# Patient Record
Sex: Female | Born: 1985 | Race: White | Hispanic: No | Marital: Married | Smoking: Current every day smoker
Health system: Southern US, Community
[De-identification: ages and names within clinical notes are randomized; demographics above are authoritative.]

## PROBLEM LIST (undated history)

## (undated) ENCOUNTER — Ambulatory Visit (HOSPITAL_COMMUNITY): Admission: EM | Discharge status: Absent without leave | Payer: 59

## (undated) DIAGNOSIS — F32A Depression, unspecified: Secondary | ICD-10-CM

## (undated) DIAGNOSIS — Z789 Other specified health status: Secondary | ICD-10-CM

## (undated) DIAGNOSIS — F419 Anxiety disorder, unspecified: Secondary | ICD-10-CM

## (undated) HISTORY — PX: TUBAL LIGATION: SHX77

## (undated) HISTORY — DX: Depression, unspecified: F32.A

## (undated) HISTORY — DX: Anxiety disorder, unspecified: F41.9

## (undated) HISTORY — PX: APPENDECTOMY: SHX54

---

## 2005-07-03 DIAGNOSIS — F1920 Other psychoactive substance dependence, uncomplicated: Secondary | ICD-10-CM

## 2005-07-03 HISTORY — DX: Other psychoactive substance dependence, uncomplicated: F19.20

## 2010-05-27 ENCOUNTER — Emergency Department (HOSPITAL_COMMUNITY)
Admission: EM | Admit: 2010-05-27 | Discharge: 2010-05-27 | Payer: Self-pay | Source: Home / Self Care | Admitting: Emergency Medicine

## 2011-05-22 ENCOUNTER — Inpatient Hospital Stay (HOSPITAL_COMMUNITY)
Admission: EM | Admit: 2011-05-22 | Discharge: 2011-05-23 | DRG: 343 | Disposition: A | Discharge status: Home / Self Care | Payer: Self-pay | Attending: Surgery | Admitting: Surgery

## 2011-05-22 ENCOUNTER — Emergency Department (INDEPENDENT_AMBULATORY_CARE_PROVIDER_SITE_OTHER)
Admission: EM | Admit: 2011-05-22 | Discharge: 2011-05-22 | Disposition: A | Discharge status: Nursing Home (Includes ICF) | Payer: Self-pay | Source: Home / Self Care

## 2011-05-22 ENCOUNTER — Encounter: Payer: Self-pay | Admitting: *Deleted

## 2011-05-22 ENCOUNTER — Emergency Department (HOSPITAL_COMMUNITY): Payer: Self-pay

## 2011-05-22 ENCOUNTER — Encounter (HOSPITAL_COMMUNITY): Payer: Self-pay | Admitting: *Deleted

## 2011-05-22 DIAGNOSIS — Z23 Encounter for immunization: Secondary | ICD-10-CM

## 2011-05-22 DIAGNOSIS — Z9049 Acquired absence of other specified parts of digestive tract: Secondary | ICD-10-CM

## 2011-05-22 DIAGNOSIS — R1031 Right lower quadrant pain: Secondary | ICD-10-CM

## 2011-05-22 DIAGNOSIS — K3580 Unspecified acute appendicitis: Secondary | ICD-10-CM | POA: Diagnosis present

## 2011-05-22 DIAGNOSIS — K37 Unspecified appendicitis: Principal | ICD-10-CM | POA: Diagnosis present

## 2011-05-22 HISTORY — DX: Other specified health status: Z78.9

## 2011-05-22 LAB — DIFFERENTIAL
Basophils Absolute: 0 10*3/uL (ref 0.0–0.1)
Eosinophils Absolute: 0.2 10*3/uL (ref 0.0–0.7)
Eosinophils Relative: 2 % (ref 0–5)
Neutrophils Relative %: 62 % (ref 43–77)

## 2011-05-22 LAB — POCT I-STAT, CHEM 8
BUN: 11 mg/dL (ref 6–23)
Chloride: 103 mEq/L (ref 96–112)
Creatinine, Ser: 0.6 mg/dL (ref 0.50–1.10)
Glucose, Bld: 86 mg/dL (ref 70–99)
Potassium: 4.2 mEq/L (ref 3.5–5.1)

## 2011-05-22 LAB — POCT URINALYSIS DIP (DEVICE)
Bilirubin Urine: NEGATIVE
Glucose, UA: NEGATIVE mg/dL
Hgb urine dipstick: NEGATIVE
Nitrite: NEGATIVE
Urobilinogen, UA: 0.2 mg/dL (ref 0.0–1.0)
pH: 6.5 (ref 5.0–8.0)

## 2011-05-22 LAB — CBC
MCH: 29.7 pg (ref 26.0–34.0)
MCV: 90.9 fL (ref 78.0–100.0)
Platelets: 200 10*3/uL (ref 150–400)
RDW: 12.9 % (ref 11.5–15.5)
WBC: 8 10*3/uL (ref 4.0–10.5)

## 2011-05-22 MED ORDER — ONDANSETRON HCL 4 MG/2ML IJ SOLN
4.0000 mg | Freq: Once | INTRAMUSCULAR | Status: AC
  Administered 2011-05-22: 4 mg via INTRAVENOUS
  Filled 2011-05-22: qty 2

## 2011-05-22 MED ORDER — HYDROMORPHONE HCL PF 1 MG/ML IJ SOLN
1.0000 mg | Freq: Once | INTRAMUSCULAR | Status: AC
  Administered 2011-05-22: 1 mg via INTRAVENOUS
  Filled 2011-05-22: qty 1

## 2011-05-22 MED ORDER — SODIUM CHLORIDE 0.9 % IV SOLN
Freq: Once | INTRAVENOUS | Status: AC
  Administered 2011-05-22 – 2011-05-23 (×2): via INTRAVENOUS

## 2011-05-22 NOTE — ED Provider Notes (Signed)
Medical screening examination/treatment/procedure(s) were performed by non-physician practitioner and as supervising physician I was immediately available for consultation/collaboration.   Jshawn Hurta L Kamariya Blevens, MD 05/22/11 2332 

## 2011-05-22 NOTE — ED Provider Notes (Signed)
History     CSN: [id number] Arrival date & time: 05/22/2011  4:58 PM   None     Chief Complaint  Patient presents with  . Abdominal Pain    (Consider location/radiation/quality/duration/timing/severity/associated sxs/prior treatment) HPI Comments: Pt states she awoke at 4 am this morning with severe RLQ abd pain, nausea and vomiting. She also had a tactile fever. The pain was severe and she considered going to the ER but instead decided to take Hydrocodone and Phenergan that she had at home. She has nausea and vomited one time after eating. No diarrhea. Last BM was 2-3 days ago - normal for her. She denies dysuria, urinary frequency or urgency, or vaginal discharge. She had a normal menses approx 1 1/2 weeks ago. The pain has improved some since this morning.   History reviewed. No pertinent past medical history.  Past Surgical History  Procedure Date  . Cesarean section   . Tubal ligation     Family History  Problem Relation Age of Onset  . Hypertension Father   . Heart disease Father     History  Substance Use Topics  . Smoking status: Current Everyday Smoker  . Smokeless tobacco: Not on file  . Alcohol Use: Yes     occassional    OB History    Grav Para Term Preterm Abortions TAB SAB Ect Mult Living                  Review of Systems  Constitutional: Negative for fever and chills.  Respiratory: Negative for cough and chest tightness.   Cardiovascular: Negative for chest pain.  Gastrointestinal: Positive for nausea, vomiting and abdominal pain. Negative for diarrhea and constipation.  Genitourinary: Negative for dysuria, urgency, frequency, vaginal bleeding and vaginal discharge.    Allergies  Review of patient's allergies indicates no known allergies.  Home Medications   Current Outpatient Rx  Name Route Sig Dispense Refill  . PROMETHAZINE HCL 25 MG PO TABS Oral Take 25 mg by mouth as needed.        BP 104/69  Pulse 88  Temp(Src) 98 F (36.7 C)  (Oral)  Resp 18  SpO2 98%  LMP 05/17/2011  Physical Exam  Nursing note and vitals reviewed. Constitutional: She appears well-developed and well-nourished. No distress.  Cardiovascular: Normal rate, regular rhythm and normal heart sounds.   Pulmonary/Chest: Effort normal and breath sounds normal. No respiratory distress.  Abdominal: Soft. Bowel sounds are normal. She exhibits no mass. There is no hepatosplenomegaly. There is tenderness in the right lower quadrant. There is tenderness at McBurney's point. There is no rigidity, no rebound, no guarding, no CVA tenderness and negative Murphy's sign.  Skin: Skin is warm and dry.  Psychiatric: She has a normal mood and affect.    ED Course  Procedures (including critical care time)  Labs Reviewed  POCT URINALYSIS DIP (DEVICE) - Abnormal; Notable for the following:    Leukocytes, UA SMALL (*) Biochemical Testing Only. Please order routine urinalysis from main lab if confirmatory testing is needed.   All other components within normal limits  POCT PREGNANCY, URINE  POCT URINALYSIS DIPSTICK   No results found.   No diagnosis found.    MDM  Pt transferred to ED for imaging studies.         Melody Comas, [place] 05/22/11 [phone]

## 2011-05-22 NOTE — ED Notes (Signed)
rlq pain  Since 0400am today.  Transferred from [facility].  Nausea  lmp last wednesday

## 2011-05-22 NOTE — ED Provider Notes (Signed)
Medical screening examination/treatment/procedure(s) were performed by non-physician practitioner and as supervising physician I was immediately available for consultation/collaboration.  Raynald Blend, MD 05/22/11 1840

## 2011-05-22 NOTE — ED Notes (Signed)
Pt c/o RLQ pain onset 4am today.  Has had N/V also.  States that she took her husband's Promethazine for the pain.  Tried eating cheese crackers around 3pm, but vomited them.  Pt tender to palpation around umbilicus and very tender on RLQ.  States pain is better when she has her legs pulled up.  States she thinks she had a fever this am.  C/o urinary frequency past few days, but denies burning or pressure with urination.  Last BM was Saturday which she states is normal for her. Denies vaginal discharge.

## 2011-05-22 NOTE — ED Provider Notes (Signed)
Medical screening examination/treatment/procedure(s) were performed by non-physician practitioner and as supervising physician I was immediately available for consultation/collaboration.   Jolin Benavides L Rashae Rother, MD 05/22/11 2248 

## 2011-05-22 NOTE — ED Provider Notes (Addendum)
History     CSN: [id number] Arrival date & time: 05/22/2011  6:24 PM   First MD Initiated Contact with Patient 05/22/11 2210      Chief Complaint  Patient presents with  . Abdominal Pain    (Consider location/radiation/quality/duration/timing/severity/associated sxs/prior treatment) HPI Comments: Awakened at 4am with stabbing RLQ pain that has been unchanged throughout the days + nausea, no change in bowel/bladder denies vag discharge abnormal PAP's LMP 8 days ago   Patient is a 25 y.o. female presenting with abdominal pain. The history is provided by the patient.  Abdominal Pain The primary symptoms of the illness include abdominal pain and nausea. The primary symptoms of the illness do not include fever, fatigue, vomiting, diarrhea, dysuria, vaginal discharge or vaginal bleeding. The current episode started 13 to 24 hours ago. The onset of the illness was sudden. The problem has not changed since onset. The patient states that she believes she is currently not pregnant. The patient has not had a change in bowel habit. Symptoms associated with the illness do not include chills, constipation, urgency, frequency or back pain.    History reviewed. No pertinent past medical history.  Past Surgical History  Procedure Date  . Cesarean section   . Tubal ligation     Family History  Problem Relation Age of Onset  . Hypertension Father   . Heart disease Father     History  Substance Use Topics  . Smoking status: Current Everyday Smoker  . Smokeless tobacco: Not on file  . Alcohol Use: Yes     occassional    OB History    Grav Para Term Preterm Abortions TAB SAB Ect Mult Living                  Review of Systems  Constitutional: Negative.  Negative for fever, chills and fatigue.  HENT: Negative.   Eyes: Negative.   Respiratory: Negative.   Cardiovascular: Negative.   Gastrointestinal: Positive for nausea, abdominal pain and abdominal distention. Negative for vomiting,  diarrhea and constipation.  Genitourinary: Negative for dysuria, urgency, frequency, vaginal bleeding, vaginal discharge and pelvic pain.  Musculoskeletal: Negative.  Negative for back pain.  Skin: Negative.   Neurological: Negative.   Psychiatric/Behavioral: Negative.     Allergies  Review of patient's allergies indicates no known allergies.  Home Medications   Current Outpatient Rx  Name Route Sig Dispense Refill  . PROMETHAZINE HCL 25 MG PO TABS Oral Take 25 mg by mouth as needed. nausea      BP 103/72  Pulse 92  Temp(Src) 97.9 F (36.6 C) (Oral)  Resp 19  SpO2 100%  LMP 05/17/2011  Physical Exam  Constitutional: She appears well-developed and well-nourished.  HENT:  Head: Atraumatic.  Eyes: EOM are normal.  Neck: Neck supple.  Cardiovascular: Regular rhythm.   Pulmonary/Chest: Breath sounds normal.  Abdominal: Soft. She exhibits no distension. There is tenderness. There is no rebound and no guarding.  Genitourinary: There is tenderness around the vagina. No vaginal discharge found.  Lymphadenopathy:       Right: Inguinal adenopathy present.    ED Course  Procedures (including critical care time)   Labs Reviewed  CBC  DIFFERENTIAL  I-STAT, CHEM 8  GC/CHLAMYDIA PROBE AMP, GENITAL  WET PREP, GENITAL   No results found.   No diagnosis found.  .now After review of CT Scans aptient has contusions no fractures Will DC home with pain control 3:43 AM Dr. Laurelyn Sickle will see  MDM  Will  prefeorm pelvic exam get ultrasound         Arman Filter, NP 05/22/11 2216  Arman Filter, NP 05/22/11 2252  Arman Filter, NP 05/22/11 2259  Arman Filter, NP 05/23/11 [phone]

## 2011-05-23 ENCOUNTER — Emergency Department (HOSPITAL_COMMUNITY): Payer: Self-pay | Admitting: Anesthesiology

## 2011-05-23 ENCOUNTER — Encounter (HOSPITAL_COMMUNITY): Payer: Self-pay | Admitting: Anesthesiology

## 2011-05-23 ENCOUNTER — Other Ambulatory Visit (INDEPENDENT_AMBULATORY_CARE_PROVIDER_SITE_OTHER): Payer: Self-pay | Admitting: General Surgery

## 2011-05-23 ENCOUNTER — Encounter (HOSPITAL_COMMUNITY): Payer: Self-pay | Admitting: Emergency Medicine

## 2011-05-23 ENCOUNTER — Encounter (HOSPITAL_COMMUNITY): Admission: EM | Disposition: A | Discharge status: Home / Self Care | Payer: Self-pay | Source: Home / Self Care

## 2011-05-23 ENCOUNTER — Encounter (HOSPITAL_COMMUNITY): Payer: Self-pay | Admitting: General Surgery

## 2011-05-23 ENCOUNTER — Encounter (HOSPITAL_COMMUNITY): Payer: Self-pay | Admitting: General Practice

## 2011-05-23 ENCOUNTER — Emergency Department (HOSPITAL_COMMUNITY): Payer: Self-pay

## 2011-05-23 DIAGNOSIS — K3580 Unspecified acute appendicitis: Secondary | ICD-10-CM | POA: Diagnosis present

## 2011-05-23 DIAGNOSIS — Z9049 Acquired absence of other specified parts of digestive tract: Secondary | ICD-10-CM

## 2011-05-23 HISTORY — DX: Unspecified acute appendicitis: K35.80

## 2011-05-23 HISTORY — PX: LAPAROSCOPIC APPENDECTOMY: SHX408

## 2011-05-23 LAB — GC/CHLAMYDIA PROBE AMP, GENITAL
Chlamydia, DNA Probe: NEGATIVE
GC Probe Amp, Genital: NEGATIVE

## 2011-05-23 SURGERY — APPENDECTOMY, LAPAROSCOPIC
Anesthesia: General | Site: Abdomen | Wound class: Contaminated

## 2011-05-23 MED ORDER — DEXAMETHASONE SODIUM PHOSPHATE 10 MG/ML IJ SOLN
INTRAMUSCULAR | Status: DC | PRN
  Administered 2011-05-23: 4 mg via INTRAVENOUS

## 2011-05-23 MED ORDER — INFLUENZA VIRUS VACC SPLIT PF IM SUSP: 0.5000 mL | INTRAMUSCULAR | Status: DC

## 2011-05-23 MED ORDER — ONDANSETRON HCL 4 MG/2ML IJ SOLN: 4.0000 mg | Freq: Four times a day (QID) | INTRAMUSCULAR | Status: DC | PRN

## 2011-05-23 MED ORDER — LACTATED RINGERS IV SOLN
INTRAVENOUS | Status: DC | PRN
  Administered 2011-05-23: 07:00:00 via INTRAVENOUS

## 2011-05-23 MED ORDER — SODIUM CHLORIDE 0.9 % IV SOLN
1.0000 g | Freq: Once | INTRAVENOUS | Status: AC
  Administered 2011-05-23: 1 g via INTRAVENOUS
  Filled 2011-05-23: qty 1

## 2011-05-23 MED ORDER — METOCLOPRAMIDE HCL 5 MG/ML IJ SOLN: 10.0000 mg | Freq: Once | INTRAMUSCULAR | Status: DC | PRN

## 2011-05-23 MED ORDER — ONDANSETRON HCL 4 MG/2ML IJ SOLN
INTRAMUSCULAR | Status: DC | PRN
  Administered 2011-05-23: 4 mg via INTRAVENOUS

## 2011-05-23 MED ORDER — NICOTINE 14 MG/24HR TD PT24
14.0000 mg | MEDICATED_PATCH | Freq: Every day | TRANSDERMAL | Status: DC
Start: 2011-05-23 — End: 2011-05-23
  Filled 2011-05-23: qty 1

## 2011-05-23 MED ORDER — HYDROCODONE-ACETAMINOPHEN 5-325 MG PO TABS
1.0000 | ORAL_TABLET | ORAL | Status: DC | PRN
  Administered 2011-05-23 (×2): 2 via ORAL
  Filled 2011-05-23 (×2): qty 2

## 2011-05-23 MED ORDER — DIPHENHYDRAMINE HCL 12.5 MG/5ML PO ELIX
12.5000 mg | ORAL_SOLUTION | Freq: Four times a day (QID) | ORAL | Status: DC | PRN
  Filled 2011-05-23: qty 10

## 2011-05-23 MED ORDER — KETOROLAC TROMETHAMINE 30 MG/ML IJ SOLN: 15.0000 mg | Freq: Once | INTRAMUSCULAR | Status: DC | PRN

## 2011-05-23 MED ORDER — SODIUM CHLORIDE 0.9 % IR SOLN
Status: DC | PRN
  Administered 2011-05-23: 1

## 2011-05-23 MED ORDER — NEOSTIGMINE METHYLSULFATE 1 MG/ML IJ SOLN
INTRAMUSCULAR | Status: DC | PRN
  Administered 2011-05-23: 2 mg via INTRAVENOUS

## 2011-05-23 MED ORDER — DOCUSATE SODIUM 100 MG PO CAPS
100.0000 mg | ORAL_CAPSULE | Freq: Two times a day (BID) | ORAL | Status: DC
  Administered 2011-05-23: 100 mg via ORAL
  Filled 2011-05-23: qty 1

## 2011-05-23 MED ORDER — DROPERIDOL 2.5 MG/ML IJ SOLN
INTRAMUSCULAR | Status: DC | PRN
  Administered 2011-05-23: .6 mg via INTRAVENOUS

## 2011-05-23 MED ORDER — ACETAMINOPHEN 325 MG PO TABS: 650.0000 mg | ORAL_TABLET | Freq: Four times a day (QID) | ORAL | Status: DC | PRN

## 2011-05-23 MED ORDER — HYDROMORPHONE HCL PF 1 MG/ML IJ SOLN
1.0000 mg | Freq: Once | INTRAMUSCULAR | Status: AC
  Administered 2011-05-23: 1 mg via INTRAVENOUS
  Filled 2011-05-23: qty 1

## 2011-05-23 MED ORDER — LIDOCAINE HCL 1 % IJ SOLN
INTRAMUSCULAR | Status: DC | PRN
  Administered 2011-05-23: 50 mg via INTRADERMAL

## 2011-05-23 MED ORDER — MORPHINE SULFATE 2 MG/ML IJ SOLN
2.0000 mg | INTRAMUSCULAR | Status: DC | PRN
  Administered 2011-05-23 (×2): 2 mg via INTRAVENOUS
  Filled 2011-05-23 (×2): qty 1

## 2011-05-23 MED ORDER — DEXTROSE IN LACTATED RINGERS 5 % IV SOLN
INTRAVENOUS | Status: DC
  Administered 2011-05-23 (×2): via INTRAVENOUS

## 2011-05-23 MED ORDER — IBUPROFEN 200 MG PO TABS: 200.0000 mg | ORAL_TABLET | Freq: Four times a day (QID) | ORAL | Status: DC | PRN

## 2011-05-23 MED ORDER — SUCCINYLCHOLINE CHLORIDE 20 MG/ML IJ SOLN
INTRAMUSCULAR | Status: DC | PRN
  Administered 2011-05-23: 100 mg via INTRAVENOUS

## 2011-05-23 MED ORDER — GLYCOPYRROLATE 0.2 MG/ML IJ SOLN
INTRAMUSCULAR | Status: DC | PRN
  Administered 2011-05-23: .4 mg via INTRAVENOUS

## 2011-05-23 MED ORDER — MIDAZOLAM HCL 5 MG/5ML IJ SOLN
INTRAMUSCULAR | Status: DC | PRN
  Administered 2011-05-23: 2 mg via INTRAVENOUS

## 2011-05-23 MED ORDER — PROPOFOL 10 MG/ML IV EMUL
INTRAVENOUS | Status: DC | PRN
  Administered 2011-05-23: 200 mg via INTRAVENOUS

## 2011-05-23 MED ORDER — DIPHENHYDRAMINE HCL 50 MG/ML IJ SOLN: 12.5000 mg | Freq: Four times a day (QID) | INTRAMUSCULAR | Status: DC | PRN

## 2011-05-23 MED ORDER — ACETAMINOPHEN 650 MG RE SUPP: 650.0000 mg | Freq: Four times a day (QID) | RECTAL | Status: DC | PRN

## 2011-05-23 MED ORDER — HYDROCODONE-ACETAMINOPHEN 5-325 MG PO TABS: 1.0000 | ORAL_TABLET | ORAL | Status: AC | PRN

## 2011-05-23 MED ORDER — BUPIVACAINE-EPINEPHRINE 0.25% -1:200000 IJ SOLN
INTRAMUSCULAR | Status: DC | PRN
  Administered 2011-05-23: 9 mL

## 2011-05-23 MED ORDER — IOHEXOL 300 MG/ML  SOLN
100.0000 mL | Freq: Once | INTRAMUSCULAR | Status: AC | PRN
  Administered 2011-05-23: 100 mL via INTRAVENOUS

## 2011-05-23 MED ORDER — FENTANYL CITRATE 0.05 MG/ML IJ SOLN: 25.0000 ug | INTRAMUSCULAR | Status: DC | PRN

## 2011-05-23 MED ORDER — FENTANYL CITRATE 0.05 MG/ML IJ SOLN
INTRAMUSCULAR | Status: DC | PRN
  Administered 2011-05-23: 75 ug via INTRAVENOUS
  Administered 2011-05-23: 100 ug via INTRAVENOUS

## 2011-05-23 MED ORDER — VECURONIUM BROMIDE 10 MG IV SOLR
INTRAVENOUS | Status: DC | PRN
  Administered 2011-05-23: 3 mg via INTRAVENOUS

## 2011-05-23 MED ORDER — KETOROLAC TROMETHAMINE 15 MG/ML IJ SOLN
INTRAMUSCULAR | Status: DC | PRN
  Administered 2011-05-23: 15 mg via INTRAVENOUS

## 2011-05-23 SURGICAL SUPPLY — 42 items
APPLIER CLIP ROT 10 11.4 M/L (STAPLE)
BLADE SURG ROTATE 9660 (MISCELLANEOUS) IMPLANT
CANISTER SUCTION 2500CC (MISCELLANEOUS) ×2 IMPLANT
CHLORAPREP W/TINT 26ML (MISCELLANEOUS) ×4 IMPLANT
CLIP APPLIE ROT 10 11.4 M/L (STAPLE) IMPLANT
CLOTH BEACON ORANGE TIMEOUT ST (SAFETY) ×2 IMPLANT
COVER SURGICAL LIGHT HANDLE (MISCELLANEOUS) ×2 IMPLANT
CUTTER FLEX LINEAR 45M (STAPLE) ×2 IMPLANT
DERMABOND ADVANCED (GAUZE/BANDAGES/DRESSINGS) ×1
DERMABOND ADVANCED .7 DNX12 (GAUZE/BANDAGES/DRESSINGS) ×1 IMPLANT
DRAPE WARM FLUID 44X44 (DRAPE) ×2 IMPLANT
ELECT REM PT RETURN 9FT ADLT (ELECTROSURGICAL) ×2
ELECTRODE REM PT RTRN 9FT ADLT (ELECTROSURGICAL) ×1 IMPLANT
ENDOLOOP SUT PDS II  0 18 (SUTURE) ×1
ENDOLOOP SUT PDS II 0 18 (SUTURE) ×1 IMPLANT
GLOVE BIO SURGEON STRL SZ 6 (GLOVE) ×2 IMPLANT
GLOVE BIOGEL PI IND STRL 6.5 (GLOVE) ×1 IMPLANT
GLOVE BIOGEL PI IND STRL 7.5 (GLOVE) ×1 IMPLANT
GLOVE BIOGEL PI INDICATOR 6.5 (GLOVE) ×1
GLOVE BIOGEL PI INDICATOR 7.5 (GLOVE) ×1
GLOVE SURG SS PI 7.0 STRL IVOR (GLOVE) ×2 IMPLANT
GOWN PREVENTION PLUS XXLARGE (GOWN DISPOSABLE) ×2 IMPLANT
GOWN STRL NON-REIN LRG LVL3 (GOWN DISPOSABLE) ×2 IMPLANT
GOWN STRL REIN 2XL LVL4 (GOWN DISPOSABLE) IMPLANT
KIT BASIN OR (CUSTOM PROCEDURE TRAY) ×2 IMPLANT
KIT ROOM TURNOVER OR (KITS) ×2 IMPLANT
NS IRRIG 1000ML POUR BTL (IV SOLUTION) ×2 IMPLANT
PAD ARMBOARD 7.5X6 YLW CONV (MISCELLANEOUS) ×2 IMPLANT
POUCH SPECIMEN RETRIEVAL 10MM (ENDOMECHANICALS) ×2 IMPLANT
RELOAD STAPLE TA45 3.5 REG BLU (ENDOMECHANICALS) ×2 IMPLANT
SCALPEL HARMONIC ACE (MISCELLANEOUS) ×2 IMPLANT
SET IRRIG TUBING LAPAROSCOPIC (IRRIGATION / IRRIGATOR) ×2 IMPLANT
SLEEVE ENDOPATH XCEL 5M (ENDOMECHANICALS) ×2 IMPLANT
SPECIMEN JAR SMALL (MISCELLANEOUS) ×2 IMPLANT
SUT MNCRL AB 4-0 PS2 18 (SUTURE) ×2 IMPLANT
TOWEL OR 17X24 6PK STRL BLUE (TOWEL DISPOSABLE) ×2 IMPLANT
TOWEL OR 17X26 10 PK STRL BLUE (TOWEL DISPOSABLE) ×2 IMPLANT
TRAY FOLEY CATH 14FR (SET/KITS/TRAYS/PACK) ×2 IMPLANT
TRAY LAPAROSCOPIC (CUSTOM PROCEDURE TRAY) ×2 IMPLANT
TROCAR XCEL BLUNT TIP 100MML (ENDOMECHANICALS) ×2 IMPLANT
TROCAR XCEL NON-BLD 5MMX100MML (ENDOMECHANICALS) ×2 IMPLANT
WATER STERILE IRR 1000ML POUR (IV SOLUTION) IMPLANT

## 2011-05-23 NOTE — Transfer of Care (Signed)
Immediate Anesthesia Transfer of Care Note  Patient: Margaret Alvarado  Procedure(s) Performed:  APPENDECTOMY LAPAROSCOPIC  Patient Location: PACU  Anesthesia Type: General  Level of Consciousness: awake  Airway & Oxygen Therapy: Patient Spontanous Breathing  Post-op Assessment: Report given to PACU RN  Post vital signs: stable  Complications: No apparent anesthesia complications

## 2011-05-23 NOTE — Discharge Summary (Signed)
AGREE WITH PA NOTE AND ASSESSMENT.  REVIEWED AND AGREE

## 2011-05-23 NOTE — Progress Notes (Signed)
DC @  this time with husband. DC instructions read to pt and husband. Verbally understood. No questions at DC.

## 2011-05-23 NOTE — ED Notes (Signed)
Pt st's pain is returning, Dondra Spry NP, made aware, verbal order given.

## 2011-05-23 NOTE — Op Note (Signed)
Appendectomy, Lap, Operative Note  Indications: The patient presented with a history of right-sided abdominal pain. A CT revealed findings consistent with acute appendicitis.  Pre-operative Diagnosis: Acute appendicitis without mention of peritonitis  Post-operative Diagnosis: Same  Surgeon: [facility]   Assistants: None  Anesthesia: General endotracheal anesthesia  ASA Class: 2  Procedure Details  The patient was seen again in the Holding Room. The risks, benefits, complications, treatment options, and expected outcomes were discussed with the patient and/or family. The possibilities of reaction to medication, perforation of viscus, bleeding, recurrent infection, finding a normal appendix, the need for additional procedures, and creating a complication requiring transfusion or operation were discussed. There was concurrence with the proposed plan and informed consent was obtained. The site of surgery was properly noted. The patient was taken to Operating Room, identified as Margaret Alvarado and the procedure verified as Appendectomy. A Time Out was held and the above information confirmed.  The patient was placed in the supine position and general anesthesia was induced, along with placement of orogastric tube, Venodyne boots, and a Foley catheter. The abdomen was prepped and draped in a sterile fashion. A one centimeter infraumbilical incision was made.  The umbilical stalk was elevated, and the midline fascia was incised with a #11 blade.  A Kelly clamp was used to confirm entrance into the peritoneal cavity.  A pursestring suture was placed around the fascial incision with a 0 Vicryl.  The Hasson was introduced into the abdomen, and the tails of the suture were used to hold the Hasson in place.   The pneumoperitoneum was then established to steady pressure of 15 mmHg.  Additional 5 mm cannulas then placed in the left lower quadrant of the abdomen and the suprapubic region under direct  visualization. A careful evaluation of the entire abdomen was carried out. The patient was placed in Trendelenburg and left lateral decubitus position. The small intestines were retracted in the cephalad and left lateral direction away from the pelvis and right lower quadrant. The patient was found to have an enlarged and inflamed appendix that was extending into the pelvis. There was no evidence of perforation.  The appendix was carefully dissected. The appendix was was skeletonized with the harmonic scalpel.   The appendix was divided at its base using an endo-GIA stapler. Minimal appendiceal stump was left in place. There was no evidence of bleeding, leakage, or complication after division of the appendix. Irrigation was also performed and irrigate suctioned from the abdomen as well.  The umbilical port site was closed with the purse string suture. There was no residual palpable fascial defect.  The trocar site skin wounds were closed with 4-0 Monocryl.  The wounds were cleaned, dried, and dressed with Dermabond.    Instrument, sponge, and needle counts were correct at the conclusion of the case.   Findings: The appendix was found to be inflamed. There were not signs of necrosis.  There was not perforation. There was not abscess formation.  Estimated Blood Loss:  Minimal         Drains: None         Total IV Fluids:  1300 mL         Specimens: Appendix          Complications:  None; patient tolerated the procedure well.         Disposition: PACU - hemodynamically stable.         Condition: stable

## 2011-05-23 NOTE — ED Provider Notes (Signed)
Medical screening examination/treatment/procedure(s) were performed by non-physician practitioner and as supervising physician I was immediately available for consultation/collaboration.   Talullah Abate D Quanta Robertshaw, MD 05/23/11 1509 

## 2011-05-23 NOTE — H&P (Signed)
Margaret Alvarado is an 25 y.o. female.   Chief Complaint: Abdominal pain  HPI: Margaret Alvarado is a 25 year old female with 24 hours of abdominal pain and nausea. The pain woke her from sleep around 4 am yesterday.  She threw up once.  She did have shaking chills once, but has not had a fever.  She has never had pain like this before.  She has not been able to eat much yesterday.  She has not had diarrhea or constipation.     History reviewed. No pertinent past medical history.  Past Surgical History  Procedure Date  . Cesarean section   . Tubal ligation     Family History  Problem Relation Age of Onset  . Hypertension Father   . Heart disease Father    Social History:  reports that she has been smoking.  She does not have any smokeless tobacco history on file. She reports that she drinks alcohol. She reports that she does not use illicit drugs.  Allergies: No Known Allergies  Medications Prior to Admission  Medication Dose Route Frequency Provider Last Rate Last Dose  . 0.9 %  sodium chloride infusion   Intravenous Once Arman Filter, NP 50 mL/hr at 05/23/11 0320    . HYDROmorphone (DILAUDID) injection 1 mg  1 mg Intravenous Once Arman Filter, NP   1 mg at 05/22/11 2259  . HYDROmorphone (DILAUDID) injection 1 mg  1 mg Intravenous Once Arman Filter, NP   1 mg at 05/23/11 0320  . iohexol (OMNIPAQUE) 300 MG/ML injection 100 mL  100 mL Intravenous Once PRN Medication Radiologist   100 mL at 05/23/11 0253  . ondansetron (ZOFRAN) injection 4 mg  4 mg Intravenous Once Arman Filter, NP   4 mg at 05/22/11 2300   No current outpatient prescriptions on file as of 05/22/2011.      Review of Systems  All other systems reviewed and are negative.    Blood pressure 103/71, pulse 91, temperature 98.1 F (36.7 C), temperature source Oral, resp. rate 19, height 5\' 7"  (1.702 m), weight 120 lb (54.432 kg), last menstrual period 05/17/2011, SpO2 96.00%. Physical Exam  Constitutional: She is oriented  to person, place, and time. She appears well-developed and well-nourished. No distress.  HENT:  Head: Normocephalic and atraumatic.  Mouth/Throat: No oropharyngeal exudate.  Eyes: Pupils are equal, round, and reactive to light. No scleral icterus.  Neck: Normal range of motion. Neck supple. No tracheal deviation present. No thyromegaly present.  Cardiovascular: Normal rate, normal heart sounds and intact distal pulses.  Exam reveals no gallop and no friction rub.   No murmur heard. Respiratory: Effort normal and breath sounds normal. No respiratory distress. She has no wheezes. She has no rales.  GI: Soft. Bowel sounds are normal. She exhibits no distension and no mass. There is tenderness (RLQ tenderness). There is guarding (Voluntary guarding). There is no rebound.  Musculoskeletal: Normal range of motion. She exhibits no tenderness.  Lymphadenopathy:    She has no cervical adenopathy.  Neurological: She is alert and oriented to person, place, and time. Coordination normal.  Skin: Skin is warm and dry. No rash noted. She is not diaphoretic. No erythema. No pallor.  Psychiatric: She has a normal mood and affect. Her behavior is normal. Judgment and thought content normal.    Results for orders placed during the hospital encounter of 05/22/11 (from the past 48 hour(s))  CBC     Status: Normal  Collection Time   05/22/11 10:22 PM      Component Value Range Comment   WBC 8.0  4.0 - 10.5 (K/uL)    RBC 4.38  3.87 - 5.11 (MIL/uL)    Hemoglobin 13.0  12.0 - 15.0 (g/dL)    HCT 95.6  21.3 - 08.6 (%)    MCV 90.9  78.0 - 100.0 (fL)    MCH 29.7  26.0 - 34.0 (pg)    MCHC 32.7  30.0 - 36.0 (g/dL)    RDW 57.8  46.9 - 62.9 (%)    Platelets 200  150 - 400 (K/uL)   DIFFERENTIAL     Status: Normal   Collection Time   05/22/11 10:22 PM      Component Value Range Comment   Neutrophils Relative 62  43 - 77 (%)    Neutro Abs 4.9  1.7 - 7.7 (K/uL)    Lymphocytes Relative 28  12 - 46 (%)    Lymphs Abs  2.3  0.7 - 4.0 (K/uL)    Monocytes Relative 8  3 - 12 (%)    Monocytes Absolute 0.6  0.1 - 1.0 (K/uL)    Eosinophils Relative 2  0 - 5 (%)    Eosinophils Absolute 0.2  0.0 - 0.7 (K/uL)    Basophils Relative 0  0 - 1 (%)    Basophils Absolute 0.0  0.0 - 0.1 (K/uL)   POCT I-STAT, CHEM 8     Status: Normal   Collection Time   05/22/11 10:37 PM      Component Value Range Comment   Sodium 139  135 - 145 (mEq/L)    Potassium 4.2  3.5 - 5.1 (mEq/L)    Chloride 103  96 - 112 (mEq/L)    BUN 11  6 - 23 (mg/dL)    Creatinine, Ser 5.28  0.50 - 1.10 (mg/dL)    Glucose, Bld 86  70 - 99 (mg/dL)    Calcium, Ion 4.13  1.12 - 1.32 (mmol/L)    TCO2 27  0 - 100 (mmol/L)    Hemoglobin 13.9  12.0 - 15.0 (g/dL)    HCT 24.4  01.0 - 27.2 (%)   WET PREP, GENITAL     Status: Normal   Collection Time   05/22/11 10:46 PM      Component Value Range Comment   Yeast, Wet Prep NONE SEEN  NONE SEEN     Trich, Wet Prep NONE SEEN  NONE SEEN     Clue Cells, Wet Prep NONE SEEN  NONE SEEN     WBC, Wet Prep HPF POC NONE SEEN  NONE SEEN      Ct Abdomen Pelvis W Contrast  05/23/2011  *RADIOLOGY REPORT*  Clinical Data: Right lower quadrant abdominal pain.  CT ABDOMEN AND PELVIS WITH CONTRAST  Technique:  Multidetector CT imaging of the abdomen and pelvis was performed following the standard protocol during bolus administration of intravenous contrast.  Contrast: OMNIPAQUE IOHEXOL 300 MG/ML IV SOLN  Comparison: 05/22/2019 ultrasound  Findings: Limited images through the lung bases demonstrate no significant appreciable abnormality. The heart size is within normal limits. No pleural or pericardial effusion.  Unremarkable liver, biliary system, spleen, pancreas, adrenal glands, kidneys.  No hydronephrosis or hydroureter.  No bowel obstruction.  No CT evidence for colitis.  The appendix is thick-walled and distended up to 9 mm. Despite contrast reaching the colon, the appendix fails to opacify. No periappendiceal  inflammation.  No lymphadenopathy.  Normal caliber vasculature.  Thin-walled bladder.  Unremarkable uterus and adnexa.  No acute osseous abnormality.  IMPRESSION: Distended / thick-walled appendix which fails to opacify with contrast.  These findings suggest early appendicitis.  Discussed via telephone with Dr. Hyacinth Meeker at 03:00 a.m. on 05/23/2011.  Original Report Authenticated By: Waneta Martins, M.D.   [place] Art/ven Flow Abd Pelv Doppler  05/23/2011  *RADIOLOGY REPORT*  Clinical Data: Right lower quadrant pelvic pain.  TRANSABDOMINAL AND TRANSVAGINAL ULTRASOUND OF PELVIS  IMPRESSION: Color Doppler flow with arterial and venous wave forms documented to both ovaries.  No evidence of pelvic mass or other significant abnormality.  Original Report Authenticated By: Waneta Martins, M.D.    Assessment/Plan Acute appendicitis  IV Fluids, IV antibiotics. NPO To OR for laparoscopic appendectomy  Appendectomy was described to the patient.  The incisions and surgical technique were explained.  The patient was advised that some of the hair on the abdomen would be clipped, and that a foley catheter would be placed.  I advised the patient of the risks of surgery including, but not limited to, bleeding, infection, damage to other structures, risk of an open operation, risk of abscess, and risk of blood clot, risk of having a normal appendix.  The recovery was also described to the patient.  She was advised that she will have lifting restrictions for 2 weeks.     Bo Rogue 05/23/2011, 4:38 AM

## 2011-05-23 NOTE — ED Notes (Signed)
Patient transported to OR, bedside report to [employer].  Belongings to husband who is in central waiting.

## 2011-05-23 NOTE — ED Notes (Signed)
Consult at bedside.

## 2011-05-23 NOTE — ED Notes (Signed)
Assumed care of patient, report from [place], patient pending admission for early appendicitis.  Introduced self to patient and significant other, patient ambulatory to restroom, vitals stable, patient just medicated with Dilaudid, reports pain 3/10 at this time, denies nausea,  call bell within reach, will continue to monitor.

## 2011-05-23 NOTE — Anesthesia Preprocedure Evaluation (Addendum)
Anesthesia Evaluation  Patient identified by MRN, date of birth, ID band Patient awake    Reviewed: Allergy & Precautions, H&P , NPO status , Patient's Chart, lab work & pertinent test results, reviewed documented beta blocker date and time   Airway Mallampati: I TM Distance: >3 FB Neck ROM: Full    Dental No notable dental hx. (+) Teeth Intact and Dental Advisory Given   Pulmonary Current Smoker,    Pulmonary exam normal       Cardiovascular neg cardio ROS     Neuro/Psych Negative Neurological ROS  Negative Psych ROS   GI/Hepatic negative GI ROS, Neg liver ROS,   Endo/Other  Negative Endocrine ROS  Renal/GU negative Renal ROS  Genitourinary negative   Musculoskeletal   Abdominal   Peds  Hematology negative hematology ROS (+)   Anesthesia Other Findings See surgeon's H&P   Reproductive/Obstetrics negative OB ROS                          Anesthesia Physical Anesthesia Plan  ASA: II and Emergent  Anesthesia Plan: General   Post-op Pain Management:    Induction:   Airway Management Planned:   Additional Equipment:   Intra-op Plan:   Post-operative Plan:   Informed Consent: I have reviewed the patients History and Physical, chart, labs and discussed the procedure including the risks, benefits and alternatives for the proposed anesthesia with the patient or authorized representative who has indicated his/her understanding and acceptance.     Plan Discussed with: CRNA and Surgeon  Anesthesia Plan Comments:         Anesthesia Quick Evaluation

## 2011-05-23 NOTE — Anesthesia Postprocedure Evaluation (Signed)
Anesthesia Post Note  Patient: Margaret Alvarado  Procedure(s) Performed:  APPENDECTOMY LAPAROSCOPIC  Anesthesia type: General  Patient location: PACU  Post pain: Pain level controlled  Post assessment: Patient's Cardiovascular Status Stable  Last Vitals:  Filed Vitals:   05/23/11 0715  BP:   Pulse: 79  Temp:   Resp: 17    Post vital signs: Reviewed and stable  Level of consciousness: sedated  Complications: No apparent anesthesia complications

## 2011-05-23 NOTE — ED Notes (Signed)
Pt drinking contrast for CT.  Family at bedside. 

## 2011-05-23 NOTE — Interval H&P Note (Signed)
History and Physical Interval Note:   05/23/2011   5:13 AM   Margaret Alvarado  has presented today for surgery, with the diagnosis of acute appendicitis  The various methods of treatment have been discussed with the patient and family. After consideration of risks, benefits and other options for treatment, the patient has consented to  Procedure(s): APPENDECTOMY LAPAROSCOPIC as a surgical intervention .  The patients' history has been reviewed, patient examined, no change in status, stable for surgery.  I have reviewed the patients' chart and labs.  Questions were answered to the patient's satisfaction.     [facility]  MD

## 2011-05-23 NOTE — Anesthesia Procedure Notes (Signed)
Procedure Name: Intubation Date/Time: 05/23/2011 6:11 AM Performed by: Alanda Amass Pre-anesthesia Checklist: Patient identified, Emergency Drugs available, Suction available, Patient being monitored and Timeout performed Patient Re-evaluated:Patient Re-evaluated prior to inductionOxygen Delivery Method: Circle System Utilized Preoxygenation: Pre-oxygenation with 100% oxygen Intubation Type: IV induction, Circoid Pressure applied and Rapid sequence Laryngoscope Size: Mac and 3 Grade View: Grade I Tube type: Oral Tube size: 7.5 mm Number of attempts: 1 Placement Confirmation: ETT inserted through vocal cords under direct vision Secured at: 20 cm Tube secured with: Tape Dental Injury: Teeth and Oropharynx as per pre-operative assessment

## 2011-05-23 NOTE — Discharge Summary (Signed)
Patient ID: Margaret Alvarado MRN: [id number] DOB/AGE: 05-Jun-1986 25 y.o.  Admit date: 05/22/2011 Discharge date: 05/23/2011  Procedures: lap appy by Dr. Donell Beers  Consults: none  Reason for Admission:  Acute appendicitis  Admission Diagnoses: 1. Acute appendicitis  Hospital Course: Pt was admitted and taken to the operating room for a lap appy.  Pt tolerated the procedure well.  Later on POD 0 she was tolerating a regular diet, voiding, and pain was well controlled with po pain meds.  At this time, she was felt stable for d/c home.  PE: Abd: soft, appropriately tender, incisions c/d/i with dermabond.  +BS, ND  Discharge Diagnoses:  Principal Problem:  *Appendicitis, acute Active Problems:  S/P laparoscopic appendectomy   Discharge Medications: Current Discharge Medication List    START taking these medications   Details  HYDROcodone-acetaminophen (NORCO) 5-325 MG per tablet Take 1-2 tablets by mouth every 4 (four) hours as needed. Qty: 40 tablet, Refills: 0      CONTINUE these medications which have NOT CHANGED   Details  promethazine (PHENERGAN) 25 MG tablet Take 25 mg by mouth as needed. nausea        Discharge Instructions: Follow-up Information    Follow up with [facility] on 06/06/2011. (2:00pm  arrive at 1:45)    Contact information:   [phone] [address]         Signed: Treshawn Allen E 05/23/2011, 2:42 PM

## 2011-05-24 ENCOUNTER — Encounter (HOSPITAL_COMMUNITY): Payer: Self-pay | Admitting: General Surgery

## 2011-05-30 ENCOUNTER — Telehealth (INDEPENDENT_AMBULATORY_CARE_PROVIDER_SITE_OTHER): Payer: Self-pay | Admitting: General Surgery

## 2011-05-30 NOTE — Telephone Encounter (Signed)
REFILL REQ RECEIVED FROM [facility] FOR HYDROCODONE-ACET 5-325 #40. OK'D PER V.O. DR. T. CORNETT/[facility] NOTIFIED/ [phone]

## 2011-06-06 ENCOUNTER — Encounter (INDEPENDENT_AMBULATORY_CARE_PROVIDER_SITE_OTHER): Payer: Self-pay

## 2012-08-05 ENCOUNTER — Emergency Department (HOSPITAL_COMMUNITY)
Admission: EM | Admit: 2012-08-05 | Discharge: 2012-08-05 | Disposition: A | Discharge status: Home / Self Care | Payer: Self-pay | Attending: Emergency Medicine | Admitting: Emergency Medicine

## 2012-08-05 ENCOUNTER — Encounter (HOSPITAL_COMMUNITY): Payer: Self-pay | Admitting: Emergency Medicine

## 2012-08-05 ENCOUNTER — Emergency Department (HOSPITAL_COMMUNITY): Payer: Self-pay

## 2012-08-05 DIAGNOSIS — S4980XA Other specified injuries of shoulder and upper arm, unspecified arm, initial encounter: Secondary | ICD-10-CM | POA: Insufficient documentation

## 2012-08-05 DIAGNOSIS — S20219A Contusion of unspecified front wall of thorax, initial encounter: Secondary | ICD-10-CM | POA: Insufficient documentation

## 2012-08-05 DIAGNOSIS — W19XXXA Unspecified fall, initial encounter: Secondary | ICD-10-CM

## 2012-08-05 DIAGNOSIS — F17200 Nicotine dependence, unspecified, uncomplicated: Secondary | ICD-10-CM | POA: Insufficient documentation

## 2012-08-05 DIAGNOSIS — S59909A Unspecified injury of unspecified elbow, initial encounter: Secondary | ICD-10-CM | POA: Insufficient documentation

## 2012-08-05 DIAGNOSIS — Z79899 Other long term (current) drug therapy: Secondary | ICD-10-CM | POA: Insufficient documentation

## 2012-08-05 DIAGNOSIS — Y939 Activity, unspecified: Secondary | ICD-10-CM | POA: Insufficient documentation

## 2012-08-05 DIAGNOSIS — S46909A Unspecified injury of unspecified muscle, fascia and tendon at shoulder and upper arm level, unspecified arm, initial encounter: Secondary | ICD-10-CM | POA: Insufficient documentation

## 2012-08-05 DIAGNOSIS — W108XXA Fall (on) (from) other stairs and steps, initial encounter: Secondary | ICD-10-CM | POA: Insufficient documentation

## 2012-08-05 DIAGNOSIS — S6990XA Unspecified injury of unspecified wrist, hand and finger(s), initial encounter: Secondary | ICD-10-CM | POA: Insufficient documentation

## 2012-08-05 DIAGNOSIS — Y929 Unspecified place or not applicable: Secondary | ICD-10-CM | POA: Insufficient documentation

## 2012-08-05 DIAGNOSIS — Z87828 Personal history of other (healed) physical injury and trauma: Secondary | ICD-10-CM | POA: Insufficient documentation

## 2012-08-05 MED ORDER — CYCLOBENZAPRINE HCL 10 MG PO TABS: 5.0000 mg | ORAL_TABLET | Freq: Two times a day (BID) | ORAL | Status: DC | PRN

## 2012-08-05 MED ORDER — TRAMADOL HCL 50 MG PO TABS: 50.0000 mg | ORAL_TABLET | Freq: Four times a day (QID) | ORAL | Status: DC | PRN

## 2012-08-05 NOTE — Progress Notes (Signed)
States Blue cross and blue shield coverage will be effective on 08/31/12 Informed how to obtain an in network provider through  [employer]
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
## 2012-08-05 NOTE — Progress Notes (Signed)
Pt listed with no insurance coverage CM and [facility] liaison spoke with pt.  Pt offered services to assist with finding a [place] self pay provider, resources & health reform information

## 2012-08-05 NOTE — ED Notes (Signed)
Patient transported to X-ray 

## 2012-08-05 NOTE — ED Notes (Signed)
Pt complains of back pain and left arm and shoulder pain after " falling down 10 steps this morning"

## 2012-08-07 NOTE — ED Provider Notes (Signed)
Medical screening examination/treatment/procedure(s) were performed by non-physician practitioner and as supervising physician I was immediately available for consultation/collaboration.  Casyn Becvar R. Angie Piercey, MD 08/07/12 1454 

## 2015-08-24 ENCOUNTER — Emergency Department
Admission: EM | Admit: 2015-08-24 | Discharge: 2015-08-24 | Disposition: A | Discharge status: Home / Self Care | Payer: 59 | Source: Home / Self Care | Attending: Family Medicine | Admitting: Family Medicine

## 2015-08-24 ENCOUNTER — Encounter: Payer: Self-pay | Admitting: *Deleted

## 2015-08-24 DIAGNOSIS — Z20828 Contact with and (suspected) exposure to other viral communicable diseases: Secondary | ICD-10-CM | POA: Diagnosis not present

## 2015-08-24 DIAGNOSIS — R6889 Other general symptoms and signs: Secondary | ICD-10-CM

## 2015-08-24 MED ORDER — OSELTAMIVIR PHOSPHATE 75 MG PO CAPS: 75.0000 mg | ORAL_CAPSULE | Freq: Two times a day (BID) | ORAL | Status: DC

## 2015-08-24 MED ORDER — BENZONATATE 100 MG PO CAPS: 100.0000 mg | ORAL_CAPSULE | Freq: Three times a day (TID) | ORAL | Status: DC

## 2015-08-24 MED ORDER — ACETAMINOPHEN 325 MG PO TABS
650.0000 mg | ORAL_TABLET | Freq: Once | ORAL | Status: AC
  Administered 2015-08-24: 650 mg via ORAL

## 2015-08-24 MED ORDER — DEXAMETHASONE SODIUM PHOSPHATE 10 MG/ML IJ SOLN
10.0000 mg | Freq: Once | INTRAMUSCULAR | Status: AC
  Administered 2015-08-24: 10 mg via INTRAMUSCULAR

## 2015-08-24 NOTE — Discharge Instructions (Signed)

## 2015-08-24 NOTE — ED Notes (Signed)
Pt c/o HA, cough, sore throat and aches with low grade fever since Sunday night. Son + for influenza, she has not received a flu vaccine.

## 2015-08-24 NOTE — ED Provider Notes (Signed)
CSN: [id number]     Arrival date & time 08/24/15  1653 History   First MD Initiated Contact with Patient 08/24/15 1715     Chief Complaint  Patient presents with  . Headache  . Cough   (Consider location/radiation/quality/duration/timing/severity/associated sxs/prior Treatment) HPI The pt is a 30yo female presenting to [facility] with c/o generalized headache, mild intermittent productive cough and sore throat with body aches and low grade fever for 2 days.  She states her son tested positive for the flu, then stated he had a false negative and was started on Tamiflu.  She has not received the flu vaccine this year.  Headache is most bothersome for pt.  She took ibuprofen about 1-2 hours PTA but no relief.  She has also been taking mucinex without relief. Denies n/v/d. No hx of asthma.   Past Medical History  Diagnosis Date  . No pertinent past medical history    Past Surgical History  Procedure Laterality Date  . Cesarean section    . Tubal ligation    . Appendectomy      05/23/2011  . Laparoscopic appendectomy  05/23/2011    Procedure: APPENDECTOMY LAPAROSCOPIC;  Surgeon: Almond Lint, MD;  Location: [facility] OR;  Service: General;  Laterality: N/A;   Family History  Problem Relation Age of Onset  . Hypertension Father   . Heart disease Father    Social History  Substance Use Topics  . Smoking status: Current Every Day Smoker -- 0.50 packs/day for 12 years    Types: Cigarettes  . Smokeless tobacco: Never Used     Comment: Smoking consult requested  . Alcohol Use: Yes     Comment: occassional   OB History    No data available     Review of Systems  Constitutional: Positive for fever, chills and fatigue.  HENT: Positive for congestion and sore throat. Negative for ear pain, trouble swallowing and voice change.   Respiratory: Positive for cough. Negative for shortness of breath.   Cardiovascular: Negative for chest pain and palpitations.  Gastrointestinal: Negative for nausea,  vomiting, abdominal pain and diarrhea.  Musculoskeletal: Positive for myalgias and arthralgias. Negative for back pain.  Skin: Negative for rash.  Neurological: Positive for headaches. Negative for dizziness and light-headedness.    Allergies  Review of patient's allergies indicates no known allergies.  Home Medications   Prior to Admission medications   Medication Sig Start Date End Date Taking? Authorizing Provider  benzonatate (TESSALON) 100 MG capsule Take 1 capsule (100 mg total) by mouth every 8 (eight) hours. 08/24/15   Junius Finner, PA-C  oseltamivir (TAMIFLU) 75 MG capsule Take 1 capsule (75 mg total) by mouth every 12 (twelve) hours. 08/24/15   Junius Finner, PA-C   Meds Ordered and Administered this Visit   Medications  dexamethasone (DECADRON) injection 10 mg (10 mg Intramuscular Given 08/24/15 1738)  acetaminophen (TYLENOL) tablet 650 mg (650 mg Oral Given 08/24/15 1738)    BP 108/75 mmHg  Pulse 82  Temp(Src) 97.9 F (36.6 C) (Oral)  Resp 16  Ht  (1.702 m)  Wt 151 lb (68.493 kg)  BMI 23.64 kg/m2  SpO2 99%  LMP 08/13/2015 No data found.   Physical Exam  Constitutional: She is oriented to person, place, and time. She appears well-developed and well-nourished. No distress.  HENT:  Head: Normocephalic and atraumatic.  Right Ear: Tympanic membrane normal.  Left Ear: Tympanic membrane normal.  Nose: Rhinorrhea present.  Mouth/Throat: Uvula is midline and mucous membranes are  normal. Posterior oropharyngeal erythema present. No oropharyngeal exudate, posterior oropharyngeal edema or tonsillar abscesses.  Eyes: Conjunctivae and EOM are normal. Pupils are equal, round, and reactive to light. No scleral icterus.  Neck: Normal range of motion. Neck supple.  No meningeal signs  Cardiovascular: Normal rate, regular rhythm and normal heart sounds.   Pulmonary/Chest: Effort normal and breath sounds normal. No respiratory distress. She has no wheezes. She has no rales.  She exhibits no tenderness.  Abdominal: Soft. She exhibits no distension. There is no tenderness.  Musculoskeletal: Normal range of motion.  Neurological: She is alert and oriented to person, place, and time.  Skin: Skin is warm and dry. She is not diaphoretic.  Nursing note and vitals reviewed.   ED Course  Procedures (including critical care time)  Labs Review Labs Reviewed - No data to display  Imaging Review No results found.    MDM   1. Flu-like symptoms   2. Exposure to the flu    Pt c/o headache and flu-like symptoms for 2 days. Exposure to flu.  Pt would like to be treated with Tamiflu  Tx in UC: Decadron  IM for headache, no toradol given as she had ibuprofen 1-2 hours PTA  Rx: Tamiflu and Tessalon  Advised pt to use acetaminophen and ibuprofen as needed for fever and pain. Encouraged rest and fluids. F/u with PCP in 7-10 days if not improving, sooner if worsening. Pt verbalized understanding and agreement with tx plan.     Junius Finner, PA-C 08/24/15 [phone]

## 2015-08-31 ENCOUNTER — Other Ambulatory Visit: Payer: Self-pay | Admitting: Nurse Practitioner

## 2015-08-31 ENCOUNTER — Other Ambulatory Visit (HOSPITAL_COMMUNITY)
Admission: RE | Admit: 2015-08-31 | Discharge: 2015-08-31 | Disposition: A | Discharge status: Home / Self Care | Payer: 59 | Source: Ambulatory Visit | Attending: Nurse Practitioner | Admitting: Nurse Practitioner

## 2015-08-31 DIAGNOSIS — Z1151 Encounter for screening for human papillomavirus (HPV): Secondary | ICD-10-CM | POA: Insufficient documentation

## 2015-08-31 DIAGNOSIS — Z01419 Encounter for gynecological examination (general) (routine) without abnormal findings: Secondary | ICD-10-CM | POA: Diagnosis present

## 2015-09-01 LAB — CYTOLOGY - PAP

## 2016-10-25 ENCOUNTER — Encounter (HOSPITAL_COMMUNITY): Payer: Self-pay | Admitting: Emergency Medicine

## 2016-10-25 ENCOUNTER — Ambulatory Visit (HOSPITAL_COMMUNITY)
Admission: EM | Admit: 2016-10-25 | Discharge: 2016-10-25 | Disposition: A | Discharge status: Home / Self Care | Payer: 59 | Attending: Family Medicine | Admitting: Family Medicine

## 2016-10-25 DIAGNOSIS — A084 Viral intestinal infection, unspecified: Secondary | ICD-10-CM

## 2016-10-25 DIAGNOSIS — J0110 Acute frontal sinusitis, unspecified: Secondary | ICD-10-CM | POA: Diagnosis not present

## 2016-10-25 MED ORDER — ONDANSETRON 4 MG PO TBDP: 4.0000 mg | ORAL_TABLET | Freq: Three times a day (TID) | ORAL | 1 refills | Status: DC | PRN

## 2016-10-25 MED ORDER — LOPERAMIDE HCL 2 MG PO CAPS: 2.0000 mg | ORAL_CAPSULE | Freq: Four times a day (QID) | ORAL | 0 refills | Status: DC | PRN

## 2016-10-25 MED ORDER — AMOXICILLIN-POT CLAVULANATE 875-125 MG PO TABS: 1.0000 | ORAL_TABLET | Freq: Two times a day (BID) | ORAL | 0 refills | Status: DC

## 2016-10-25 NOTE — ED Provider Notes (Signed)
CSN: [id number]     Arrival date & time 10/25/16  4098 History   First MD Initiated Contact with Patient 10/25/16 1014     Chief Complaint  Patient presents with  . Abdominal Pain   (Consider location/radiation/quality/duration/timing/severity/associated sxs/prior Treatment) The history is provided by the patient.  Abdominal Pain  Pain location:  Epigastric Pain quality: aching, cramping and dull   Pain radiates to:  Does not radiate Pain severity:  Mild Onset quality:  Gradual Duration:  1 day Timing:  Constant Progression:  Unchanged Chronicity:  New Context: eating   Context: not alcohol use, not diet changes, not recent travel, not suspicious food intake and not trauma   Relieved by: Zofran. Associated symptoms: chills, cough, diarrhea, fatigue, nausea and vomiting   Associated symptoms: no anorexia, no chest pain, no dysuria, no fever, no shortness of breath and no sore throat   URI  Presenting symptoms: congestion, cough and fatigue   Presenting symptoms: no fever and no sore throat   Severity:  Moderate Onset quality:  Gradual Duration:  10 days Timing:  Constant Progression:  Worsening Chronicity:  New Relieved by:  Decongestant and OTC medications Worsened by:  Nothing Associated symptoms: sinus pain   Associated symptoms: no arthralgias, no headaches, no myalgias, no neck pain, no sneezing, no swollen glands and no wheezing     Past Medical History:  Diagnosis Date  . No pertinent past medical history    Past Surgical History:  Procedure Laterality Date  . APPENDECTOMY     05/23/2011  . CESAREAN SECTION    . LAPAROSCOPIC APPENDECTOMY  05/23/2011   Procedure: APPENDECTOMY LAPAROSCOPIC;  Surgeon: Almond Lint, MD;  Location: [facility] OR;  Service: General;  Laterality: N/A;  . TUBAL LIGATION     Family History  Problem Relation Age of Onset  . Hypertension Father   . Heart disease Father    Social History  Substance Use Topics  . Smoking status: Current  Every Day Smoker    Packs/day: 0.50    Years: 12.00    Types: Cigarettes  . Smokeless tobacco: Never Used     Comment: Smoking consult requested  . Alcohol use Yes     Comment: occassional   OB History    No data available     Review of Systems  Constitutional: Positive for chills and fatigue. Negative for appetite change and fever.  HENT: Positive for congestion and sinus pain. Negative for sinus pressure, sneezing and sore throat.   Eyes: Negative for discharge and itching.  Respiratory: Positive for cough. Negative for shortness of breath and wheezing.   Cardiovascular: Negative for chest pain and palpitations.  Gastrointestinal: Positive for abdominal pain, diarrhea, nausea and vomiting. Negative for anorexia.  Genitourinary: Negative for dysuria and frequency.  Musculoskeletal: Negative for arthralgias, myalgias, neck pain and neck stiffness.  Skin: Negative for rash and wound.  Neurological: Negative for dizziness, light-headedness and headaches.    Allergies  Patient has no known allergies.  Home Medications   Prior to Admission medications   Medication Sig Start Date End Date Taking? Authorizing Provider  amoxicillin-clavulanate (AUGMENTIN) 875-125 MG tablet Take 1 tablet by mouth 2 (two) times daily. 10/25/16   Dorena Bodo, NP  loperamide (IMODIUM) 2 MG capsule Take 1 capsule (2 mg total) by mouth 4 (four) times daily as needed for diarrhea or loose stools. 10/25/16   Dorena Bodo, NP  ondansetron (ZOFRAN ODT) 4 MG disintegrating tablet Take 1 tablet (4 mg total) by mouth every  8 (eight) hours as needed for nausea or vomiting. 10/25/16   Dorena Bodo, NP   Meds Ordered and Administered this Visit  Medications - No data to display  BP 120/83 (BP Location: Right Arm)   Pulse 82   Temp 98.2 F (36.8 C) (Oral)   Resp 18   SpO2 97%  No data found.   Physical Exam  Constitutional: She appears well-developed and well-nourished. No distress.  HENT:   Head: Normocephalic and atraumatic.  Right Ear: Tympanic membrane and external ear normal.  Left Ear: Tympanic membrane and external ear normal.  Nose: Mucosal edema present. Right sinus exhibits frontal sinus tenderness. Left sinus exhibits frontal sinus tenderness.  Eyes: Conjunctivae are normal. Right eye exhibits no discharge. Left eye exhibits no discharge.  Neck: Normal range of motion.  Cardiovascular: Normal rate and regular rhythm.   Pulmonary/Chest: Effort normal and breath sounds normal.  Abdominal: Normal appearance and bowel sounds are normal. There is no hepatosplenomegaly. There is tenderness in the epigastric area. There is no tenderness at McBurney's point and negative Murphy's sign.  Neurological: She is alert.  Skin: Skin is warm and dry. Capillary refill takes less than 2 seconds. No rash noted. She is not diaphoretic.  Psychiatric: She has a normal mood and affect. Her behavior is normal.  Nursing note and vitals reviewed.   Urgent Care Course     Procedures (including critical care time)  Labs Review Labs Reviewed - No data to display  Imaging Review No results found.    MDM   1. Acute frontal sinusitis, recurrence not specified   2. Viral gastroenteritis     Treating for sinusitis with 10 days of sinus pain and pressure. Given Augmentin, also encouraged Flonase and antihistamines daily.  Believe nausea, vomiting, diarrhea is unrelated, most likely viral gastritis, given Zofran, loperamide, advised clear liquid diet, follow-up with primary care, to the ER if symptoms worsen.     Dorena Bodo, NP 10/25/16 1057

## 2016-10-25 NOTE — ED Triage Notes (Signed)
Reports sinus issues for one week.  Patient reports vomiting and diarrhea started yesterday.  2 episodes of vomiting today, no diarrhea today.

## 2016-10-25 NOTE — Discharge Instructions (Signed)
Your sinuses, prescribed an antibiotic called Augmentin, take one tablet twice a day for 7 days. I also recommend over-the-counter Flonase 2 sprays each nostril every day, an over-the-counter antihistamine such as Claritin or Zyrtec every day both of these treatments through the remainder the allergy season.  For your nausea, vomiting, diarrhea, I think you viral gastritis. I recommend clear liquid diet such as beef broth, chicken broth, vegetable broth, Jell-O, water, Gatorade for the next 24-48 hours. I have refilled her Zofran, also given loperamide as well for diarrhea. Should your symptoms persist past one week follow up with primary care provider or return to clinic

## 2017-01-23 ENCOUNTER — Encounter (HOSPITAL_COMMUNITY): Payer: Self-pay | Admitting: Emergency Medicine

## 2017-01-23 ENCOUNTER — Ambulatory Visit (HOSPITAL_COMMUNITY)
Admission: EM | Admit: 2017-01-23 | Discharge: 2017-01-23 | Disposition: A | Discharge status: Home / Self Care | Payer: 59 | Attending: Family Medicine | Admitting: Family Medicine

## 2017-01-23 DIAGNOSIS — R053 Chronic cough: Secondary | ICD-10-CM

## 2017-01-23 DIAGNOSIS — R05 Cough: Secondary | ICD-10-CM

## 2017-01-23 DIAGNOSIS — J40 Bronchitis, not specified as acute or chronic: Secondary | ICD-10-CM | POA: Diagnosis not present

## 2017-01-23 MED ORDER — AZITHROMYCIN 250 MG PO TABS: 250.0000 mg | ORAL_TABLET | Freq: Every day | ORAL | 0 refills | Status: DC

## 2017-01-23 MED ORDER — HYDROCODONE-HOMATROPINE 5-1.5 MG/5ML PO SYRP: 5.0000 mL | ORAL_SOLUTION | Freq: Four times a day (QID) | ORAL | 0 refills | Status: DC | PRN

## 2017-01-23 NOTE — Discharge Instructions (Signed)
Today you were diagnosed with the following: 1. Bronchitis   2. Persistent cough    You have been prescribed prescription medications this visit.   Meds ordered this encounter  Medications   azithromycin (ZITHROMAX) 250 MG tablet    Sig: Take 1 tablet (250 mg total) by mouth daily. Take first 2 tablets together, then 1 every day until finished.    Dispense:  6 tablet    Refill:  0   HYDROcodone-homatropine (HYCODAN) 5-1.5 MG/5ML syrup    Sig: Take 5 mLs by mouth every 6 (six) hours as needed for cough.    Dispense:  90 mL    Refill:  0    Please take all medications as directed.  Be aware, cough medication may cause drowsiness. Please do not drive, operate heavy machinery or make important decisions while on this medication, it can cloud your judgement.  If you are not improving over the next few days or feel you are worsening please follow up here or the Emergency Department if you are unable to see your regular doctor.

## 2017-01-23 NOTE — ED Triage Notes (Signed)
Pt here for cold sx onset 2-3 weeks associated w/an intermittent HA that began 2 days ago  Sx also include prod cough, facial pressure, nasal drainage, CP, SOB  Reports husband was dx w/walking pneumonia and her kids had pos strep.   Taking OTC Mucinex and advil w/temp relief  A&O x4... NAD... Ambulatory

## 2017-01-23 NOTE — ED Provider Notes (Signed)
  [facility]   [id number] 01/23/17 Arrival Time: 1006  ASSESSMENT & PLAN:  Today you were diagnosed with the following: 1. Bronchitis   2. Persistent cough    You have been prescribed prescription medications this visit.   Meds ordered this encounter  Medications  . azithromycin (ZITHROMAX) 250 MG tablet    Sig: Take 1 tablet (250 mg total) by mouth daily. Take first 2 tablets together, then 1 every day until finished.    Dispense:  6 tablet    Refill:  0  . HYDROcodone-homatropine (HYCODAN) 5-1.5 MG/5ML syrup    Sig: Take 5 mLs by mouth every 6 (six) hours as needed for cough.    Dispense:  90 mL    Refill:  0   Please take all medications as directed.  Be aware, cough medication may cause drowsiness. Please do not drive, operate heavy machinery or make important decisions while on this medication, it can cloud your judgement.  If you are not improving over the next few days or feel you are worsening please follow up here or the Emergency Department if you are unable to see your regular doctor.    Reviewed expectations re: course of current medical issues. Questions answered. Outlined signs and symptoms indicating need for more acute intervention. Patient verbalized understanding. After Visit Summary given.   SUBJECTIVE:  Margaret Alvarado is a 31 y.o. female who presents with complaint of cold symptoms for over 2 weeks. Husband with same. Persistent dry coughing bothering her the most. Mainly night. Keeping her awake. No SOB or wheezing. Does have frontal sinus pressure. Occasional smoker. OTC Mucinex without much help. No fever reported. Normal PO intake. Headache for 2 days. Tylenol helps.  ROS: As per HPI.   OBJECTIVE:  Vitals:   01/23/17 1026  BP: 109/80  Pulse: 73  Resp: 20  Temp: 98.3 F (36.8 C)  TempSrc: Oral  SpO2: 98%    General appearance: alert; no distress HEENT: nasal congestion; frontal and maxillary sinus tenderness; post-nasal  drainage Neck: supple Lungs: clear to auscultation bilaterally Heart: regular rate and rhythm Extremities: no cyanosis or edema; symmetrical with no gross deformities Skin: warm and dry  No Known Allergies  PMHx, SurgHx, SocialHx, Medications, and Allergies were reviewed in the Visit Navigator and updated as appropriate.      Mardella LaymanHagler, Ebrima Ranta, MD 01/23/17 1046

## 2018-05-21 ENCOUNTER — Ambulatory Visit (INDEPENDENT_AMBULATORY_CARE_PROVIDER_SITE_OTHER): Payer: Self-pay

## 2018-05-21 ENCOUNTER — Encounter (HOSPITAL_COMMUNITY): Payer: Self-pay | Admitting: Emergency Medicine

## 2018-05-21 ENCOUNTER — Ambulatory Visit (HOSPITAL_COMMUNITY)
Admission: EM | Admit: 2018-05-21 | Discharge: 2018-05-21 | Disposition: A | Discharge status: Home / Self Care | Payer: Self-pay | Attending: Family Medicine | Admitting: Family Medicine

## 2018-05-21 ENCOUNTER — Other Ambulatory Visit: Payer: Self-pay

## 2018-05-21 DIAGNOSIS — R509 Fever, unspecified: Secondary | ICD-10-CM

## 2018-05-21 DIAGNOSIS — R05 Cough: Secondary | ICD-10-CM

## 2018-05-21 DIAGNOSIS — R059 Cough, unspecified: Secondary | ICD-10-CM

## 2018-05-21 MED ORDER — AZITHROMYCIN 250 MG PO TABS: 250.0000 mg | ORAL_TABLET | Freq: Every day | ORAL | 0 refills | Status: DC

## 2018-05-21 MED ORDER — HYDROCODONE-HOMATROPINE 5-1.5 MG/5ML PO SYRP: 5.0000 mL | ORAL_SOLUTION | Freq: Four times a day (QID) | ORAL | 0 refills | Status: DC | PRN

## 2018-05-21 NOTE — ED Triage Notes (Addendum)
symptoms for 1 1/2 weeks.  Initially had a cough, sinus drainage.  Burning in chest with inhalation.  Son has pneumonia, daughter has strep per patient.  Complains of headache and fever of 103 last night.  Having hot and cold chills, sweating

## 2018-05-21 NOTE — ED Provider Notes (Signed)
[facility]   [id number] 05/21/18 Arrival Time: 1304  ASSESSMENT & PLAN:  1. Cough   2. Fever and chills    Given duration of symptoms and recent onset of fever will cover with azithromycin. Recently quit smoking. Meds ordered this encounter  Medications  . azithromycin (ZITHROMAX) 250 MG tablet    Sig: Take 1 tablet (250 mg total) by mouth daily. Take first 2 tablets together, then 1 every day until finished.    Dispense:  6 tablet    Refill:  0  . HYDROcodone-homatropine (HYCODAN) 5-1.5 MG/5ML syrup    Sig: Take 5 mLs by mouth every 6 (six) hours as needed for cough.    Dispense:  60 mL    Refill:  0   No obvious infiltrate on CXR. Discussed.  Cough medication sedation precautions. Discussed typical duration of symptoms. OTC symptom care as needed. Ensure adequate fluid intake and rest. May f/u with PCP or here as needed or if worsening over the next few days.  Reviewed expectations re: course of current medical issues. Questions answered. Outlined signs and symptoms indicating need for more acute intervention. Patient verbalized understanding. After Visit Summary given.   SUBJECTIVE: History from: patient.  Margaret Alvarado is a 32 y.o. female who presents with complaint of nasal congestion, post-nasal drainage, and a persistent dry cough. Onset abrupt, about 1.5 weeks ago. Overall with fatigue and with body aches. SOB: none. Wheezing: none. Fever: yes, started 1-2 days ago; 103 degrees F last evening. Ibuprofen with some help. Overall the coughing is bothering her the most; affecting sleep. Overall normal PO intake without n/v. Sick contacts: no. No specific or significant aggravating or alleviating factors reported.  Received flu shot this year: no.  Social History   Tobacco Use  Smoking Status Current Every Day Smoker  . Packs/day: 0.50  . Years: 12.00  . Pack years: 6.00  . Types: Cigarettes  Smokeless Tobacco Never Used  Tobacco Comment   Smoking consult requested  Quit smoking about 3 months ago.  ROS: As per HPI. All other systems negative.    OBJECTIVE:  Vitals:   05/21/18 1341  BP: 104/69  Pulse: (!) 108  Resp: 20  Temp: 98.4 F (36.9 C)  TempSrc: Oral  SpO2: 99%     General appearance: alert; appears fatigued HEENT: nasal congestion; clear runny nose; throat irritation secondary to post-nasal drainage Neck: supple without LAD CV: tachycardia noted; regular rhythm without murmer Lungs: unlabored respirations, symmetrical air entry without wheezing; cough: moderate Abd: soft; non-tender Ext: no LE edema Skin: warm and dry Psychological: alert and cooperative; normal mood and affect  Imaging: Dg Chest 2 View  Result Date: 05/21/2018 CLINICAL DATA:  Cough and fever EXAM: CHEST - 2 VIEW COMPARISON:  August 05, 2012 FINDINGS: No edema or consolidation. The heart size and pulmonary vascularity are normal. No adenopathy. No bone lesions. IMPRESSION: No edema or consolidation. Electronically Signed   By: Bretta Bang III M.D.   On: 05/21/2018 14:03    No Known Allergies  Past Medical History:  Diagnosis Date  . No pertinent past medical history   Occasional "bronchitis".  Family History  Problem Relation Age of Onset  . Hypertension Father   . Heart disease Father    Social History   Socioeconomic History  . Marital status: Married    Spouse name: Not on file  . Number of children: Not on file  . Years of education: Not on file  . Highest education level:  Not on file  Occupational History  . Not on file  Social Needs  . Financial resource strain: Not on file  . Food insecurity:    Worry: Not on file    Inability: Not on file  . Transportation needs:    Medical: Not on file    Non-medical: Not on file  Tobacco Use  . Smoking status: Current Every Day Smoker    Packs/day: 0.50    Years: 12.00    Pack years: 6.00    Types: Cigarettes  . Smokeless tobacco: Never Used  . Tobacco  comment: Smoking consult requested  Substance and Sexual Activity  . Alcohol use: Yes    Comment: occassional  . Drug use: No  . Sexual activity: Yes    Birth control/protection: Surgical  Lifestyle  . Physical activity:    Days per week: Not on file    Minutes per session: Not on file  . Stress: Not on file  Relationships  . Social connections:    Talks on phone: Not on file    Gets together: Not on file    Attends religious service: Not on file    Active member of club or organization: Not on file    Attends meetings of clubs or organizations: Not on file    Relationship status: Not on file  . Intimate partner violence:    Fear of current or ex partner: Not on file    Emotionally abused: Not on file    Physically abused: Not on file    Forced sexual activity: Not on file  Other Topics Concern  . Not on file  Social History Narrative  . Not on file           Mardella LaymanHagler, Willow Reczek, MD 05/21/18 1421

## 2019-03-25 IMAGING — DX DG CHEST 2V
2 series · 2 of 2 positions shown · non-contrast
Comparison: August 05, 2012

CLINICAL DATA: Cough and fever

EXAM:
CHEST - 2 VIEW

[chest pa]
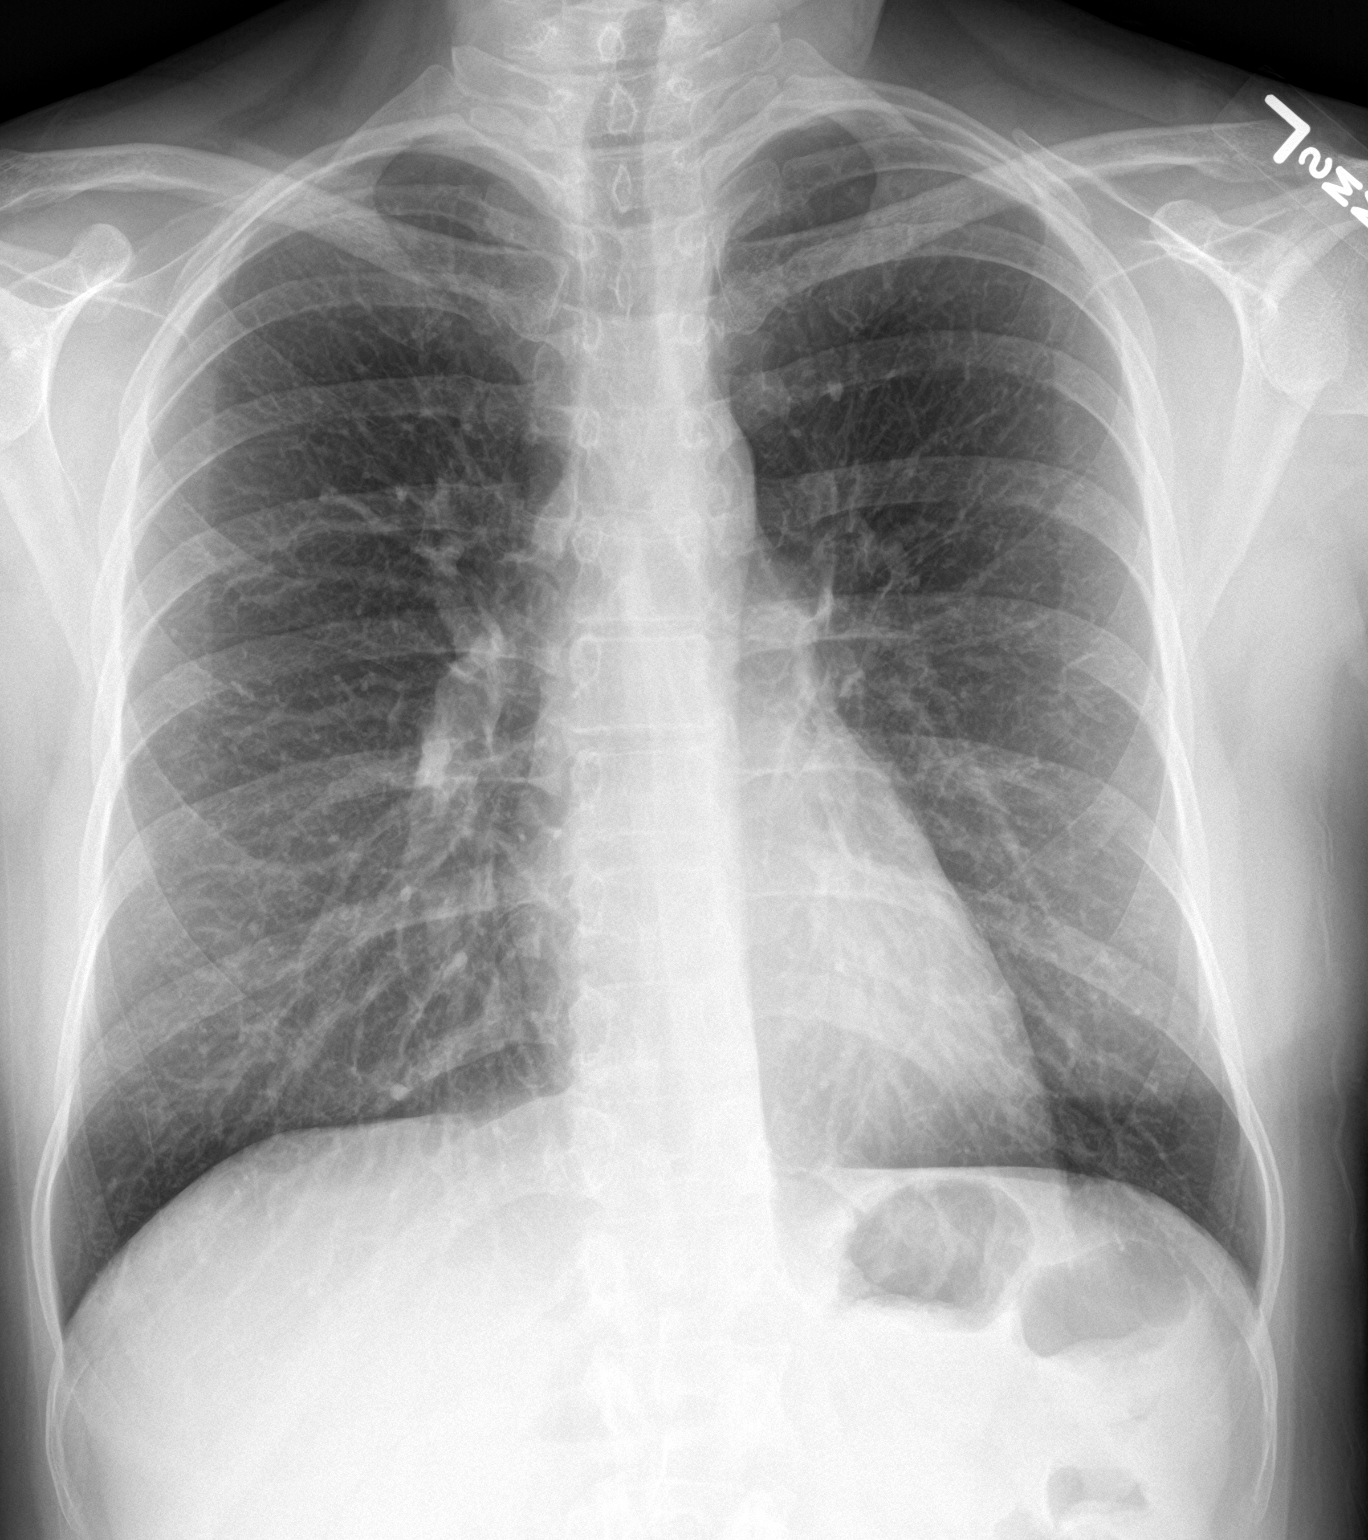

[chest lat]
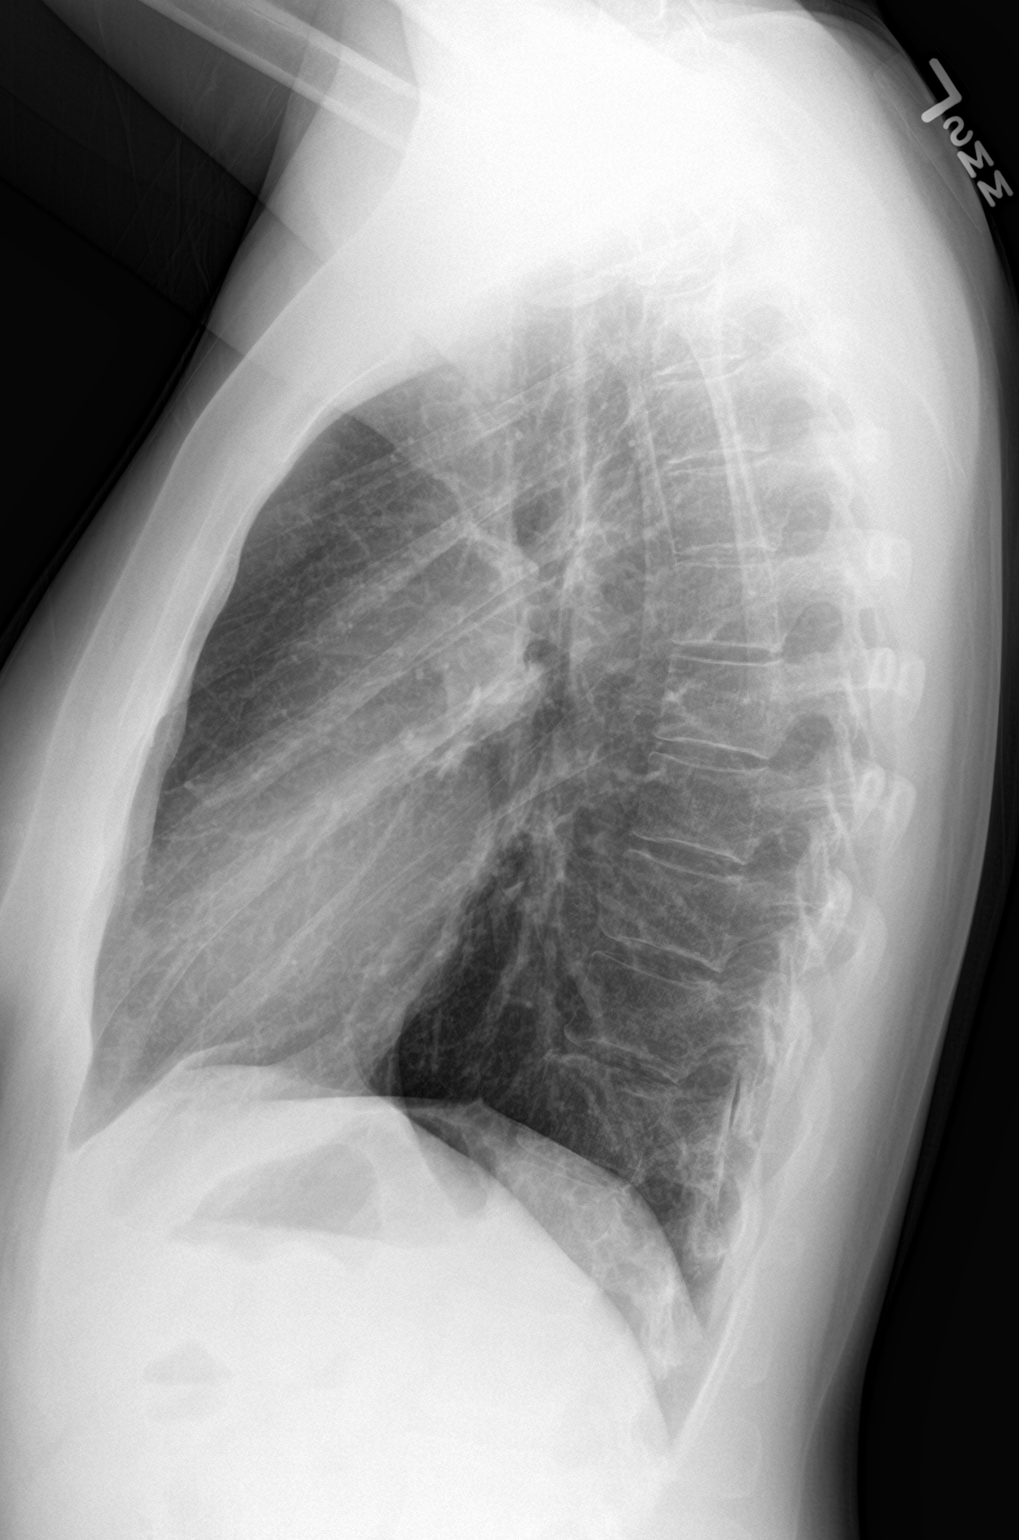

[2 of 2 positions shown; findings below may reference images not displayed]

FINDINGS: No edema or consolidation. The heart size and pulmonary vascularity
are normal. No adenopathy. No bone lesions.
IMPRESSION: No edema or consolidation.

## 2019-08-18 ENCOUNTER — Other Ambulatory Visit: Payer: Self-pay

## 2019-08-18 ENCOUNTER — Ambulatory Visit (HOSPITAL_COMMUNITY)
Admission: EM | Admit: 2019-08-18 | Discharge: 2019-08-18 | Disposition: A | Discharge status: Home / Self Care | Payer: Self-pay | Attending: Family Medicine | Admitting: Family Medicine

## 2019-08-18 ENCOUNTER — Encounter (HOSPITAL_COMMUNITY): Payer: Self-pay | Admitting: Emergency Medicine

## 2019-08-18 DIAGNOSIS — H60501 Unspecified acute noninfective otitis externa, right ear: Secondary | ICD-10-CM

## 2019-08-18 MED ORDER — NEOMYCIN-POLYMYXIN-HC 3.5-10000-1 OT SUSP: 3.0000 [drp] | Freq: Three times a day (TID) | OTIC | 0 refills | Status: DC

## 2019-08-18 MED ORDER — FLUTICASONE PROPIONATE 50 MCG/ACT NA SUSP: 1.0000 | Freq: Every day | NASAL | 0 refills | Status: DC

## 2019-08-18 NOTE — Discharge Instructions (Signed)
Please begin using Cortisporin for the next week Flonase nasal spray 1 to 2 spray in each nostril daily for the next 10 days Use anti-inflammatories for pain/swelling. You may take up to 800 mg Ibuprofen every 8 hours with food. You may supplement Ibuprofen with Tylenol [phone] mg every 8 hours.   Please follow-up if pain not improving, worsening, developing fevers, neck swelling or increased sore throat

## 2019-08-18 NOTE — ED Provider Notes (Addendum)
[facility]    CSN: [id number] Arrival date & time: 08/18/19  1143      History   Chief Complaint Chief Complaint  Patient presents with  . Otalgia    Appt 1200 right    HPI Margaret Alvarado is a 34 y.o. female no significant past medical history presenting today for evaluation of ear pain.  Patient states that she has developed pain in her right ear which began on Wednesday, symptoms have persisted over the past 4 to 5 days and worsened in the past 48 hours.  Has also developed right-sided headaches and sore throat.  Denies any associated cough, chest pain or shortness of breath.  Denies sinus congestion or pressure.  Has not used over-the-counter medicines for symptoms.  Does report using Q-tips to clean ears. HPI  Past Medical History:  Diagnosis Date  . No pertinent past medical history     Patient Active Problem List   Diagnosis Date Noted  . Appendicitis, acute 05/23/2011  . S/P laparoscopic appendectomy 05/23/2011    Past Surgical History:  Procedure Laterality Date  . APPENDECTOMY     05/23/2011  . CESAREAN SECTION    . LAPAROSCOPIC APPENDECTOMY  05/23/2011   Procedure: APPENDECTOMY LAPAROSCOPIC;  Surgeon: Almond Lint, MD;  Location: [facility] OR;  Service: General;  Laterality: N/A;  . TUBAL LIGATION      OB History   No obstetric history on file.      Home Medications    Prior to Admission medications   Medication Sig Start Date End Date Taking? Authorizing Provider  acetaminophen (TYLENOL) 325 MG tablet Take 1,000 mg by mouth every 6 (six) hours as needed.    Yes [provider]  buPROPion (WELLBUTRIN XL) 150 MG 24 hr tablet Take 150 mg by mouth every morning. 06/23/19  Yes [provider]  fluticasone (FLONASE) 50 MCG/ACT nasal spray Place 1-2 sprays into both nostrils daily for 7 days. 08/18/19 08/25/19  Matisha Termine C, PA-C  neomycin-polymyxin-hydrocortisone (CORTISPORIN) 3.5-10000-1 OTIC suspension Place 3 drops into the  right ear 3 (three) times daily. 08/18/19   Jaqueline Uber, Junius Creamer, PA-C    Family History Family History  Problem Relation Age of Onset  . Hypertension Father   . Heart disease Father     Social History Social History   Tobacco Use  . Smoking status: Current Every Day Smoker    Packs/day: 0.50    Years: 12.00    Pack years: 6.00    Types: Cigarettes  . Smokeless tobacco: Never Used  . Tobacco comment: Smoking consult requested  Substance Use Topics  . Alcohol use: Yes    Comment: occassional  . Drug use: No     Allergies   Patient has no known allergies.   Review of Systems Review of Systems  Constitutional: Negative for activity change, appetite change, chills, fatigue and fever.  HENT: Positive for ear pain and sore throat. Negative for congestion, rhinorrhea, sinus pressure and trouble swallowing.   Eyes: Negative for discharge and redness.  Respiratory: Negative for cough, chest tightness and shortness of breath.   Cardiovascular: Negative for chest pain.  Gastrointestinal: Negative for abdominal pain, diarrhea, nausea and vomiting.  Musculoskeletal: Negative for myalgias.  Skin: Negative for rash.  Neurological: Positive for headaches. Negative for dizziness and light-headedness.     Physical Exam Triage Vital Signs ED Triage Vitals [08/18/19 1205]  Enc Vitals Group     BP 116/84     Pulse Rate 83  Resp 18     Temp 98.4 F (36.9 C)     Temp Source Oral     SpO2 100 %     Weight      Height      Head Circumference      Peak Flow      Pain Score 4     Pain Loc      Pain Edu?      Excl. in GC?    No data found.  Updated Vital Signs BP 116/84 (BP Location: Right Arm)   Pulse 83   Temp 98.4 F (36.9 C) (Oral)   Resp 18   LMP 07/18/2019 (Approximate)   SpO2 100%   Visual Acuity Right Eye Distance:   Left Eye Distance:   Bilateral Distance:    Right Eye Near:   Left Eye Near:    Bilateral Near:     Physical Exam Vitals and nursing  note reviewed.  Constitutional:      Appearance: She is well-developed.     Comments: No acute distress  HENT:     Head: Normocephalic and atraumatic.     Ears:     Comments: Bilateral external auricles without erythema or tenderness to palpation, right EAC does have area of dried blood with slight swelling and erythema, otherwise canal unremarkable, TM intact with good bony landmarks and pearly gray, no sign of effusion or otitis media  Left canal and TM normal    Nose: Nose normal.     Comments: Nasal mucosa pink, nonswollen turbinates    Mouth/Throat:     Comments: Oral mucosa pink and moist, no tonsillar enlargement or exudate. Posterior pharynx patent and nonerythematous, no uvula deviation or swelling. Normal phonation.  Eyes:     Conjunctiva/sclera: Conjunctivae normal.  Neck:     Comments: No neck swelling or erythema, no lymphadenopathy, full active range of motion of neck Cardiovascular:     Rate and Rhythm: Normal rate.  Pulmonary:     Effort: Pulmonary effort is normal. No respiratory distress.     Comments: Breathing comfortably at rest, CTABL, no wheezing, rales or other adventitious sounds auscultated Abdominal:     General: There is no distension.  Musculoskeletal:        General: Normal range of motion.     Cervical back: Neck supple.  Skin:    General: Skin is warm and dry.  Neurological:     Mental Status: She is alert and oriented to person, place, and time.      UC Treatments / Results  Labs (all labs ordered are listed, but only abnormal results are displayed) Labs Reviewed - No data to display  EKG   Radiology No results found.  Procedures Procedures (including critical care time)  Medications Ordered in UC Medications - No data to display  Initial Impression / Assessment and Plan / UC Course  I have reviewed the triage vital signs and the nursing notes.  Pertinent labs & imaging results that were available during my care of the patient  were reviewed by me and considered in my medical decision making (see chart for details).     Your exam relatively unremarkable, does have area of swelling of possible canal/skin disruption from using Q-tips.  Will place on Cortisporin drops as well as providing Flonase to treat any underlying eustachian tube dysfunction.  Exam not suggestive of needing oral antibiotics at this time.  No neck swelling or sign of deep space infection.  Continue Tylenol  and ibuprofen for pain.  Continue to monitor,Discussed strict return precautions. Patient verbalized understanding and is agreeable with plan.  Final Clinical Impressions(s) / UC Diagnoses   Final diagnoses:  Acute otitis externa of right ear, unspecified type     Discharge Instructions     Please begin using Cortisporin for the next week Flonase nasal spray 1 to 2 spray in each nostril daily for the next 10 days Use anti-inflammatories for pain/swelling. You may take up to 800 mg Ibuprofen every 8 hours with food. You may supplement Ibuprofen with Tylenol [phone] mg every 8 hours.   Please follow-up if pain not improving, worsening, developing fevers, neck swelling or increased sore throat    ED Prescriptions    Medication Sig Dispense Auth. Provider   neomycin-polymyxin-hydrocortisone (CORTISPORIN) 3.5-10000-1 OTIC suspension Place 3 drops into the right ear 3 (three) times daily. 10 mL Keola Heninger C, PA-C   fluticasone (FLONASE) 50 MCG/ACT nasal spray Place 1-2 sprays into both nostrils daily for 7 days. 1 g Espn Zeman, [place] C, PA-C     PDMP not reviewed this encounter.   Joneen Caraway, [place] C, PA-C 08/18/19 1234    Janith Lima, PA-C 08/18/19 1234

## 2019-08-18 NOTE — ED Triage Notes (Signed)
Pt reports a right ear ache that started on Wednesday.  She has since developed a right sided headache and some right sided throat pain.  Pt denies fever, nasal congestion, or cough.

## 2019-11-25 ENCOUNTER — Encounter (HOSPITAL_COMMUNITY): Payer: Self-pay

## 2019-11-25 ENCOUNTER — Other Ambulatory Visit: Payer: Self-pay

## 2019-11-25 ENCOUNTER — Ambulatory Visit (HOSPITAL_COMMUNITY)
Admission: EM | Admit: 2019-11-25 | Discharge: 2019-11-25 | Disposition: A | Discharge status: Home / Self Care | Payer: HRSA Program | Attending: Family Medicine | Admitting: Family Medicine

## 2019-11-25 DIAGNOSIS — Z20822 Contact with and (suspected) exposure to covid-19: Secondary | ICD-10-CM | POA: Insufficient documentation

## 2019-11-25 DIAGNOSIS — Z7952 Long term (current) use of systemic steroids: Secondary | ICD-10-CM | POA: Insufficient documentation

## 2019-11-25 DIAGNOSIS — Z79899 Other long term (current) drug therapy: Secondary | ICD-10-CM | POA: Insufficient documentation

## 2019-11-25 DIAGNOSIS — J209 Acute bronchitis, unspecified: Secondary | ICD-10-CM | POA: Insufficient documentation

## 2019-11-25 DIAGNOSIS — F17210 Nicotine dependence, cigarettes, uncomplicated: Secondary | ICD-10-CM | POA: Insufficient documentation

## 2019-11-25 MED ORDER — ALBUTEROL SULFATE HFA 108 (90 BASE) MCG/ACT IN AERS: 1.0000 | INHALATION_SPRAY | Freq: Four times a day (QID) | RESPIRATORY_TRACT | 0 refills | Status: DC | PRN

## 2019-11-25 MED ORDER — BENZONATATE 200 MG PO CAPS: 200.0000 mg | ORAL_CAPSULE | Freq: Three times a day (TID) | ORAL | 0 refills | Status: AC | PRN

## 2019-11-25 MED ORDER — PREDNISONE 50 MG PO TABS: 50.0000 mg | ORAL_TABLET | Freq: Every day | ORAL | 0 refills | Status: AC

## 2019-11-25 MED ORDER — HYDROCODONE-HOMATROPINE 5-1.5 MG/5ML PO SYRP: 5.0000 mL | ORAL_SOLUTION | Freq: Every evening | ORAL | 0 refills | Status: DC | PRN

## 2019-11-25 NOTE — Discharge Instructions (Addendum)
Covid test pending, monitor my chart for results Continue Mucinex Tessalon/benzonatate every 8 hours as needed for cough during the day May use Hycodan cough syrup for nighttime cough Begin prednisone daily in the morning with food  Albuterol inhaler as needed for shortness of breath, wheezing, chest tightness Rest and drink plenty of fluids Honey and hot tea  Please follow-up if any symptoms not improving or worse

## 2019-11-25 NOTE — ED Provider Notes (Signed)
[facility]    CSN: [id number] Arrival date & time: 11/25/19  1231      History   Chief Complaint Chief Complaint  Patient presents with  . Nasal Congestion  . Cough    HPI Margaret Alvarado is a 34 y.o. female presenting today for evaluation of URI symptoms.  Patient has had a cough and congestion for the past 4 days, symptoms began on Saturday.  She has had a lot of congestion noted in the chest as well as in the sinuses.  She reports her chest and rib cage has become very sore especially with coughing.  She denies any known fevers.  Denies any close sick contacts.  Denies prior Covid infection, patient denies being vaccinated.  Using Mucinex with some relief.  HPI  Past Medical History:  Diagnosis Date  . No pertinent past medical history     Patient Active Problem List   Diagnosis Date Noted  . Appendicitis, acute 05/23/2011  . S/P laparoscopic appendectomy 05/23/2011    Past Surgical History:  Procedure Laterality Date  . APPENDECTOMY     05/23/2011  . CESAREAN SECTION    . LAPAROSCOPIC APPENDECTOMY  05/23/2011   Procedure: APPENDECTOMY LAPAROSCOPIC;  Surgeon: Almond Lint, MD;  Location: [facility] OR;  Service: General;  Laterality: N/A;  . TUBAL LIGATION      OB History   No obstetric history on file.      Home Medications    Prior to Admission medications   Medication Sig Start Date End Date Taking? Authorizing Provider  Pseudoephedrine-guaiFENesin [facility] D PO) Take by mouth.   Yes [provider]  acetaminophen (TYLENOL) 325 MG tablet Take 1,000 mg by mouth every 6 (six) hours as needed.     [provider]  albuterol (VENTOLIN HFA) 108 (90 Base) MCG/ACT inhaler Inhale 1-2 puffs into the lungs every 6 (six) hours as needed for wheezing or shortness of breath. 11/25/19   Daxson Reffett C, PA-C  benzonatate (TESSALON) 200 MG capsule Take 1 capsule (200 mg total) by mouth 3 (three) times daily as needed for up to 7 days for cough.  11/25/19 12/02/19  Dominik Yordy C, PA-C  buPROPion (WELLBUTRIN XL) 150 MG 24 hr tablet Take 150 mg by mouth every morning. 06/23/19   [provider]  fluticasone (FLONASE) 50 MCG/ACT nasal spray Place 1-2 sprays into both nostrils daily for 7 days. 08/18/19 08/25/19  Alica Shellhammer C, PA-C  HYDROcodone-homatropine (HYCODAN) 5-1.5 MG/5ML syrup Take 5 mLs by mouth at bedtime as needed for cough. 11/25/19   Justan Gaede C, PA-C  neomycin-polymyxin-hydrocortisone (CORTISPORIN) 3.5-10000-1 OTIC suspension Place 3 drops into the right ear 3 (three) times daily. 08/18/19   Freyja Govea C, PA-C  predniSONE (DELTASONE) 50 MG tablet Take 1 tablet (50 mg total) by mouth daily for 5 days. 11/25/19 11/30/19  Shivan Hodes, Junius Creamer, PA-C    Family History Family History  Problem Relation Age of Onset  . Hypertension Father   . Heart disease Father     Social History Social History   Tobacco Use  . Smoking status: Current Every Day Smoker    Packs/day: 0.50    Years: 12.00    Pack years: 6.00    Types: Cigarettes  . Smokeless tobacco: Never Used  . Tobacco comment: Smoking consult requested  Substance Use Topics  . Alcohol use: Yes    Comment: occassional  . Drug use: No     Allergies   Patient has no known allergies.  Review of Systems Review of Systems  Constitutional: Positive for fatigue. Negative for activity change, appetite change, chills and fever.  HENT: Positive for congestion, rhinorrhea and sinus pressure. Negative for ear pain, sore throat and trouble swallowing.   Eyes: Negative for discharge and redness.  Respiratory: Positive for cough. Negative for chest tightness and shortness of breath.   Cardiovascular: Negative for chest pain.  Gastrointestinal: Negative for abdominal pain, diarrhea, nausea and vomiting.  Musculoskeletal: Negative for myalgias.  Skin: Negative for rash.  Neurological: Positive for headaches. Negative for dizziness and light-headedness.      Physical Exam Triage Vital Signs ED Triage Vitals [11/25/19 1326]  Enc Vitals Group     BP      Pulse      Resp      Temp      Temp src      SpO2      Weight      Height      Head Circumference      Peak Flow      Pain Score 4     Pain Loc      Pain Edu?      Excl. in GC?    No data found.  Updated Vital Signs BP 113/84 (BP Location: Left Arm)   Pulse 96   Temp 98.7 F (37.1 C) (Oral)   Resp 19   LMP  (Within Weeks) Comment: 3 weeks  SpO2 97%   Visual Acuity Right Eye Distance:   Left Eye Distance:   Bilateral Distance:    Right Eye Near:   Left Eye Near:    Bilateral Near:     Physical Exam Vitals and nursing note reviewed.  Constitutional:      Appearance: She is well-developed.     Comments: No acute distress  HENT:     Head: Normocephalic and atraumatic.     Ears:     Comments: Bilateral ears without tenderness to palpation of external auricle, tragus and mastoid, EAC's without erythema or swelling, TM's with good bony landmarks and cone of light. Non erythematous.     Nose: Nose normal.     Mouth/Throat:     Comments: Oral mucosa pink and moist, no tonsillar enlargement or exudate. Posterior pharynx patent and nonerythematous, no uvula deviation or swelling. Normal phonation. Eyes:     Conjunctiva/sclera: Conjunctivae normal.  Cardiovascular:     Rate and Rhythm: Normal rate.  Pulmonary:     Effort: Pulmonary effort is normal. No respiratory distress.     Comments: Breathing comfortably at rest, coarse cough, breath sounds also coarse with bronchospasm with cough Abdominal:     General: There is no distension.  Musculoskeletal:        General: Normal range of motion.     Cervical back: Neck supple.  Skin:    General: Skin is warm and dry.  Neurological:     Mental Status: She is alert and oriented to person, place, and time.      UC Treatments / Results  Labs (all labs ordered are listed, but only abnormal results are  displayed) Labs Reviewed  SARS CORONAVIRUS 2 (TAT 6-24 HRS)    EKG   Radiology No results found.  Procedures Procedures (including critical care time)  Medications Ordered in UC Medications - No data to display  Initial Impression / Assessment and Plan / UC Course  I have reviewed the triage vital signs and the nursing notes.  Pertinent labs & imaging results  that were available during my care of the patient were reviewed by me and considered in my medical decision making (see chart for details).     Treating for bronchitis, initiating on prednisone, albuterol as needed, Tessalon for cough during the day, Hycodan for nighttime cough, continue Mucinex for congestion.  Rest and push fluids.  Covid PCR pending.  Discussed strict return precautions. Patient verbalized understanding and is agreeable with plan.  Final Clinical Impressions(s) / UC Diagnoses   Final diagnoses:  Acute bronchitis, unspecified organism     Discharge Instructions     Covid test pending, monitor my chart for results Continue Mucinex Tessalon/benzonatate every 8 hours as needed for cough during the day May use Hycodan cough syrup for nighttime cough Begin prednisone daily in the morning with food  Albuterol inhaler as needed for shortness of breath, wheezing, chest tightness Rest and drink plenty of fluids Honey and hot tea  Please follow-up if any symptoms not improving or worse     ED Prescriptions    Medication Sig Dispense Auth. Provider   predniSONE (DELTASONE) 50 MG tablet Take 1 tablet (50 mg total) by mouth daily for 5 days. 5 tablet Giada Schoppe C, PA-C   albuterol (VENTOLIN HFA) 108 (90 Base) MCG/ACT inhaler Inhale 1-2 puffs into the lungs every 6 (six) hours as needed for wheezing or shortness of breath. 8 g Joran Kallal C, PA-C   benzonatate (TESSALON) 200 MG capsule Take 1 capsule (200 mg total) by mouth 3 (three) times daily as needed for up to 7 days for cough. 28 capsule  Winton Offord C, PA-C   HYDROcodone-homatropine (HYCODAN) 5-1.5 MG/5ML syrup Take 5 mLs by mouth at bedtime as needed for cough. 75 mL Serenna Deroy, [place] C, PA-C     PDMP not reviewed this encounter.   Janith Lima, [place] 11/25/19 1453

## 2019-11-25 NOTE — ED Triage Notes (Signed)
Pt reports cough, nasal congestion, headache when coughing, chest congestion. Mucinex is "breaking" the chest congestion.

## 2019-11-26 LAB — SARS CORONAVIRUS 2 (TAT 6-24 HRS): SARS Coronavirus 2: NEGATIVE

## 2020-03-09 ENCOUNTER — Telehealth (HOSPITAL_COMMUNITY): Payer: No Payment, Other | Admitting: Psychiatry

## 2020-03-09 ENCOUNTER — Other Ambulatory Visit: Payer: Self-pay

## 2020-04-14 ENCOUNTER — Ambulatory Visit: Payer: Self-pay | Admitting: Physician Assistant

## 2020-04-24 ENCOUNTER — Telehealth (HOSPITAL_COMMUNITY): Payer: No Payment, Other | Admitting: Adult Health

## 2020-05-05 ENCOUNTER — Ambulatory Visit: Payer: Self-pay | Admitting: Physician Assistant

## 2020-05-05 DIAGNOSIS — Z0289 Encounter for other administrative examinations: Secondary | ICD-10-CM

## 2020-05-28 DIAGNOSIS — F419 Anxiety disorder, unspecified: Secondary | ICD-10-CM | POA: Insufficient documentation

## 2020-05-28 LAB — RESULTS CONSOLE HPV: CHL HPV: NEGATIVE

## 2020-05-28 LAB — HM PAP SMEAR

## 2020-06-18 ENCOUNTER — Ambulatory Visit (HOSPITAL_COMMUNITY): Payer: Self-pay

## 2020-06-22 ENCOUNTER — Ambulatory Visit (HOSPITAL_COMMUNITY): Payer: Self-pay

## 2020-07-14 DIAGNOSIS — N6001 Solitary cyst of right breast: Secondary | ICD-10-CM | POA: Insufficient documentation

## 2020-07-28 LAB — HM MAMMOGRAPHY

## 2020-08-13 ENCOUNTER — Encounter: Payer: Self-pay | Admitting: Adult Health

## 2020-08-13 ENCOUNTER — Ambulatory Visit (INDEPENDENT_AMBULATORY_CARE_PROVIDER_SITE_OTHER): Payer: 59 | Admitting: Adult Health

## 2020-08-13 ENCOUNTER — Other Ambulatory Visit: Payer: Self-pay

## 2020-08-13 VITALS — BP 122/78 | HR 98 | Ht 67.0 in | Wt 150.0 lb

## 2020-08-13 DIAGNOSIS — F411 Generalized anxiety disorder: Secondary | ICD-10-CM

## 2020-08-13 DIAGNOSIS — F422 Mixed obsessional thoughts and acts: Secondary | ICD-10-CM

## 2020-08-13 DIAGNOSIS — F909 Attention-deficit hyperactivity disorder, unspecified type: Secondary | ICD-10-CM | POA: Diagnosis not present

## 2020-08-13 DIAGNOSIS — F4310 Post-traumatic stress disorder, unspecified: Secondary | ICD-10-CM

## 2020-08-13 DIAGNOSIS — F331 Major depressive disorder, recurrent, moderate: Secondary | ICD-10-CM | POA: Diagnosis not present

## 2020-08-13 MED ORDER — ALPRAZOLAM 0.5 MG PO TABS: 0.5000 mg | ORAL_TABLET | Freq: Two times a day (BID) | ORAL | 2 refills | Status: DC | PRN

## 2020-08-13 MED ORDER — SERTRALINE HCL 50 MG PO TABS: 50.0000 mg | ORAL_TABLET | Freq: Every day | ORAL | 2 refills | Status: DC

## 2020-08-13 NOTE — Progress Notes (Signed)
[facility] MD/PA/NP Initial Note  08/13/2020 4:56 PM Margaret Alvarado  MRN:  [id number]  Chief Complaint:   HPI:  Describes mood today as "not good". Pleasant. Tearful throughout interview. Mood symptoms - reports depression, anxiety, and irritability. Stating "I feels like a prisoner in my own mind". Worries all the time - "I'm waiting for the ball to drop". Always worried something "bad" will happen to her kids. Losing the control she has "I'm always in my head". Also stating "my brain is constantly at 100%". Also stating "I can't get out of my head". Reports being diagnosed with ADHD in middle school. Feels like the Wellbutrin and Xanax have helped to calm her down, but not with the "constant worry and fear". Stating "I am borderline functioning right now". Has taken 2 weeks off of work to try and get herself in a better place.  Has been unable to tolerate increased dose of the Wellbutrin. Willing to restart Zoloft - took in high school. Stable interest and motivation. Taking medications as prescribed.  Energy levels "comes and goes". Active, has a regular exercise routine. Walking dog. Enjoys some usual interests and activities. Married. Lives with her husband and 3 children - teenagers. Family local. Spending time with family. Appetite adequate.  Weight stable - 150 pounds. Sleeps well most nights - but dreams a lot - "crazy dreams". Averages 8 hours of interrupted sleep. Focus and concentration difficulties - "it's terrible". Completing tasks. Managing aspects of household. 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 Denies SI or HI.  Denies AH or VH.  Previous medication trials: Wellbutrin XL150mg , Xanax, Prozac, Zoloft  Visit Diagnosis:    ICD-10-CM   1. Generalized anxiety disorder  F41.1 ALPRAZolam (XANAX) 0.5 MG tablet  2. Major depressive disorder, recurrent episode, moderate (HCC)  F33.1 sertraline (ZOLOFT) 50 MG tablet  3. PTSD (post-traumatic stress disorder)  F43.10    4. Attention deficit hyperactivity disorder (ADHD), unspecified ADHD type  F90.9   5. Mixed obsessional thoughts and acts  F42.2     Past Psychiatric History: Denies psychiatric hospitalization.  Past Medical History:  Past Medical History:  Diagnosis Date  . No pertinent past medical history     Past Surgical History:  Procedure Laterality Date  . APPENDECTOMY     05/23/2011  . CESAREAN SECTION    . LAPAROSCOPIC APPENDECTOMY  05/23/2011   Procedure: APPENDECTOMY LAPAROSCOPIC;  Surgeon: Almond Lint, MD;  Location: [facility] OR;  Service: General;  Laterality: N/A;  . TUBAL LIGATION      Family Psychiatric History: Family history of mental illness.  Family History:  Family History  Problem Relation Age of Onset  . Hypertension Father   . Heart disease Father     Social History:  Social History   Socioeconomic History  . Marital status: Married    Spouse name: Not on file  . Number of children: Not on file  . Years of education: Not on file  . Highest education level: Not on file  Occupational History  . Not on file  Tobacco Use  . Smoking status: Current Every Day Smoker    Packs/day: 0.50    Years: 12.00    Pack years: 6.00    Types: Cigarettes  . Smokeless tobacco: Never Used  . Tobacco comment: Smoking consult requested  Substance and Sexual Activity  . Alcohol use: Yes    Comment: occassional  . Drug use: No  . Sexual activity: Yes    Birth control/protection: Surgical  Other Topics  Concern  . Not on file  Social History Narrative  . Not on file   Social Determinants of Health   Financial Resource Strain: Not on file  Food Insecurity: Not on file  Transportation Needs: Not on file  Physical Activity: Not on file  Stress: Not on file  Social Connections: Not on file    Allergies: No Known Allergies  Metabolic Disorder Labs: No results found for: HGBA1C, MPG No results found for: PROLACTIN No results found for: CHOL, TRIG, HDL, CHOLHDL, VLDL,  LDLCALC No results found for: TSH  Therapeutic Level Labs: No results found for: LITHIUM No results found for: VALPROATE No components found for:  CBMZ  Current Medications: Current Outpatient Medications  Medication Sig Dispense Refill  . ALPRAZolam (XANAX) 0.5 MG tablet Take 1 tablet (0.5 mg total) by mouth 2 (two) times daily as needed for anxiety. 60 tablet 2  . sertraline (ZOLOFT) 50 MG tablet Take 1 tablet (50 mg total) by mouth daily. 30 tablet 2  . acetaminophen (TYLENOL) 325 MG tablet Take 1,000 mg by mouth every 6 (six) hours as needed.     Marland Kitchen albuterol (VENTOLIN HFA) 108 (90 Base) MCG/ACT inhaler Inhale 1-2 puffs into the lungs every 6 (six) hours as needed for wheezing or shortness of breath. 8 g 0  . buPROPion (WELLBUTRIN XL) 150 MG 24 hr tablet Take 150 mg by mouth every morning.    . fluticasone (FLONASE) 50 MCG/ACT nasal spray Place 1-2 sprays into both nostrils daily for 7 days. 1 g 0  . HYDROcodone-homatropine (HYCODAN) 5-1.5 MG/5ML syrup Take 5 mLs by mouth at bedtime as needed for cough. 75 mL 0  . neomycin-polymyxin-hydrocortisone (CORTISPORIN) 3.5-10000-1 OTIC suspension Place 3 drops into the right ear 3 (three) times daily. 10 mL 0  . Pseudoephedrine-guaiFENesin (MUCINEX D PO) Take by mouth.     No current facility-administered medications for this visit.    Medication Side Effects: none  Orders placed this visit:  No orders of the defined types were placed in this encounter.   Psychiatric Specialty Exam:  Review of Systems  Musculoskeletal: Negative for gait problem.  Neurological: Negative for tremors.  Psychiatric/Behavioral:       Please refer to HPI    Blood pressure 122/78, pulse 98, height 5\' 7"  (1.702 m), weight 150 lb (68 kg).Body mass index is 23.49 kg/m.  General Appearance: Casual, Neat and Well Groomed  Eye Contact:  Good  Speech:  Clear and Coherent and Normal Rate  Volume:  Normal  Mood:  Anxious and Depressed  Affect:  Appropriate and  Congruent  Thought Process:  Coherent and Descriptions of Associations: Intact  Orientation:  Full (Time, Place, and Person)  Thought Content: Logical   Suicidal Thoughts:  No  Homicidal Thoughts:  No  Memory:  WNL  Judgement:  Good  Insight:  Good  Psychomotor Activity:  Normal  Concentration:  Concentration: Good  Recall:  Good  Fund of Knowledge: Good  Language: Good  Assets:  Communication Skills Desire for Improvement Financial Resources/Insurance Housing Intimacy Leisure Time Physical Health Resilience Social Support Talents/Skills Transportation Vocational/Educational  ADL's:  Intact  Cognition: WNL  Prognosis:  Good   Screenings:   Receiving Psychotherapy: No   Treatment Plan/Recommendations:   Plan:  PDMP reviewed  1. Wellbutrin XL 150mg  every morning 2. Add Xanax 0.5mg  BID 3. Add Zoloft 50mg  daily - may increase dose between visits - will call to discuss.  Consider add stimulant when OCD symptoms improved.  Read  and reviewed note with patient for accuracy.   RTC 4 weeks  Patient advised to contact office with any questions, adverse effects, or acute worsening in signs and symptoms.  Discussed potential benefits, risk, and side effects of benzodiazepines to include potential risk of tolerance and dependence, as well as possible drowsiness.  Advised patient not to drive if experiencing drowsiness and to take lowest possible effective dose to minimize risk of dependence and tolerance.   Dorothyann Gibbs, NP

## 2020-08-13 NOTE — Addendum Note (Signed)
Addended by: Dorothyann Gibbs on: 08/13/2020 05:27 PM   Modules accepted: Level of Service

## 2020-09-09 ENCOUNTER — Ambulatory Visit (INDEPENDENT_AMBULATORY_CARE_PROVIDER_SITE_OTHER): Payer: 59 | Admitting: Adult Health

## 2020-09-09 ENCOUNTER — Encounter: Payer: Self-pay | Admitting: Adult Health

## 2020-09-09 ENCOUNTER — Other Ambulatory Visit: Payer: Self-pay

## 2020-09-09 DIAGNOSIS — F331 Major depressive disorder, recurrent, moderate: Secondary | ICD-10-CM

## 2020-09-09 MED ORDER — SERTRALINE HCL 100 MG PO TABS: 100.0000 mg | ORAL_TABLET | Freq: Every day | ORAL | 5 refills | Status: DC

## 2020-09-09 NOTE — Progress Notes (Signed)
Margaret Alvarado [id number] [birth year] 35 y.o.  Subjective:   Patient ID:  Margaret Alvarado is a 35 y.o. (DOB Nov 03, 1985) female.  Chief Complaint: No chief complaint on file.   HPI [name] presents to the office today for follow-up of GAD, MDD, PTSD, ADHD, obsessional thoughts.  Describes mood today as "getting better". Pleasant. Decreased tearfulness. Mood symptoms - reports decreased anxiety and irritability. Denies depression. Stating "I feel like the Zoloft is helping". Decreased worry. Feels like addition of Zoloft has been helpful - would like to increase dose. Has returned to work. Stable interest and motivation. Taking medications as prescribed.  Energy levels lower - "more sluggish". Active, has a regular exercise routine.   Enjoys some usual interests and activities. Married. Lives with her husband and 4 children - teenagers. Family local. Spending time with family. Appetite adequate.  Weight stable - 150 pounds. Sleeps well most nights. Averages 8 hours - waking up off and on but goes back to sleep. Focus and concentration difficulties - "it's still terrible". Completing tasks. Managing aspects of household. 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 Denies SI or HI.  Denies AH or VH.  Previous medication trials: Wellbutrin XL150mg , Xanax, Prozac, Zoloft    Review of Systems:  Review of Systems  Musculoskeletal: Negative for gait problem.  Neurological: Negative for tremors.  Psychiatric/Behavioral:       Please refer to HPI    Medications: I have reviewed the patient's current medications.  Current Outpatient Medications  Medication Sig Dispense Refill  . acetaminophen (TYLENOL) 325 MG tablet Take 1,000 mg by mouth every 6 (six) hours as needed.     Marland Kitchen albuterol (VENTOLIN HFA) 108 (90 Base) MCG/ACT inhaler Inhale 1-2 puffs into the lungs every 6 (six) hours as needed for wheezing or shortness of breath. 8 g 0  . ALPRAZolam (XANAX) 0.5 MG  tablet Take 1 tablet (0.5 mg total) by mouth 2 (two) times daily as needed for anxiety. 60 tablet 2  . buPROPion (WELLBUTRIN XL) 150 MG 24 hr tablet Take 150 mg by mouth every morning.    . fluticasone (FLONASE) 50 MCG/ACT nasal spray Place 1-2 sprays into both nostrils daily for 7 days. 1 g 0  . HYDROcodone-homatropine (HYCODAN) 5-1.5 MG/5ML syrup Take 5 mLs by mouth at bedtime as needed for cough. 75 mL 0  . neomycin-polymyxin-hydrocortisone (CORTISPORIN) 3.5-10000-1 OTIC suspension Place 3 drops into the right ear 3 (three) times daily. 10 mL 0  . Pseudoephedrine-guaiFENesin (MUCINEX D PO) Take by mouth.    . sertraline (ZOLOFT) 100 MG tablet Take 1 tablet (100 mg total) by mouth daily. 30 tablet 5   No current facility-administered medications for this visit.    Medication Side Effects: None  Allergies: No Known Allergies  Past Medical History:  Diagnosis Date  . No pertinent past medical history     Family History  Problem Relation Age of Onset  . Hypertension Father   . Heart disease Father     Social History   Socioeconomic History  . Marital status: Married    Spouse name: Not on file  . Number of children: Not on file  . Years of education: Not on file  . Highest education level: Not on file  Occupational History  . Not on file  Tobacco Use  . Smoking status: Current Every Day Smoker    Packs/day: 0.50    Years: 12.00    Pack years: 6.00    Types: Cigarettes  . Smokeless tobacco:  Never Used  . Tobacco comment: Smoking consult requested  Substance and Sexual Activity  . Alcohol use: Yes    Comment: occassional  . Drug use: No  . Sexual activity: Yes    Birth control/protection: Surgical  Other Topics Concern  . Not on file  Social History Narrative  . Not on file   Social Determinants of Health   Financial Resource Strain: Not on file  Food Insecurity: Not on file  Transportation Needs: Not on file  Physical Activity: Not on file  Stress: Not on  file  Social Connections: Not on file  Intimate Partner Violence: Not on file    Past Medical History, Surgical history, Social history, and Family history were reviewed and updated as appropriate.   Please see review of systems for further details on the patient's review from today.   Objective:   Physical Exam:  There were no vitals taken for this visit.  Physical Exam Constitutional:      General: She is not in acute distress. Musculoskeletal:        General: No deformity.  Neurological:     Mental Status: She is alert and oriented to person, place, and time.     Coordination: Coordination normal.  Psychiatric:        Attention and Perception: Attention and perception normal. She does not perceive auditory or visual hallucinations.        Mood and Affect: Mood normal. Mood is not anxious or depressed. Affect is not labile, blunt, angry or inappropriate.        Speech: Speech normal.        Behavior: Behavior normal.        Thought Content: Thought content normal. Thought content is not paranoid or delusional. Thought content does not include homicidal or suicidal ideation. Thought content does not include homicidal or suicidal plan.        Cognition and Memory: Cognition and memory normal.        Judgment: Judgment normal.     Comments: Insight intact     Lab Review:     Component Value Date/Time   NA 139 05/22/2011 2237   K 4.2 05/22/2011 2237   CL 103 05/22/2011 2237   GLUCOSE 86 05/22/2011 2237   BUN 11 05/22/2011 2237   CREATININE 0.60 05/22/2011 2237       Component Value Date/Time   WBC 8.0 05/22/2011 2222   RBC 4.38 05/22/2011 2222   HGB 13.9 05/22/2011 2237   HCT 41.0 05/22/2011 2237   PLT 200 05/22/2011 2222   MCV 90.9 05/22/2011 2222   MCH 29.7 05/22/2011 2222   MCHC 32.7 05/22/2011 2222   RDW 12.9 05/22/2011 2222   LYMPHSABS 2.3 05/22/2011 2222   MONOABS 0.6 05/22/2011 2222   EOSABS 0.2 05/22/2011 2222   BASOSABS 0.0 05/22/2011 2222    No  results found for: POCLITH, LITHIUM   No results found for: PHENYTOIN, PHENOBARB, VALPROATE, CBMZ   .res Assessment: Plan:     Plan:  PDMP reviewed  1. Wellbutrin XL 150mg  every morning 2. Xanax 0.5mg  BID 3. Increase Zoloft 50mg  to 100mg  daily   Consider add stimulant when OCD symptoms improved.  Read and reviewed note with patient for accuracy.   RTC 4 weeks  Patient advised to contact office with any questions, adverse effects, or acute worsening in signs and symptoms.  Discussed potential benefits, risk, and side effects of benzodiazepines to include potential risk of tolerance and dependence, as well as possible  drowsiness.  Advised patient not to drive if experiencing drowsiness and to take lowest possible effective dose to minimize risk of dependence and tolerance.   Diagnoses and all orders for this visit:  Major depressive disorder, recurrent episode, moderate (HCC) -     sertraline (ZOLOFT) 100 MG tablet; Take 1 tablet (100 mg total) by mouth daily.     Please see After Visit Summary for patient specific instructions.  No future appointments.  No orders of the defined types were placed in this encounter.   -------------------------------

## 2020-10-04 ENCOUNTER — Ambulatory Visit (HOSPITAL_COMMUNITY): Admit: 2020-10-04 | Discharge status: Against Medical Advice | Payer: 59

## 2020-10-05 ENCOUNTER — Other Ambulatory Visit: Payer: Self-pay

## 2020-10-05 ENCOUNTER — Ambulatory Visit (HOSPITAL_COMMUNITY)
Admission: EM | Admit: 2020-10-05 | Discharge: 2020-10-05 | Disposition: A | Discharge status: Home / Self Care | Payer: 59 | Attending: Physician Assistant | Admitting: Physician Assistant

## 2020-10-05 ENCOUNTER — Encounter (HOSPITAL_COMMUNITY): Payer: Self-pay

## 2020-10-05 VITALS — BP 107/72 | HR 98 | Temp 99.4°F | Resp 16

## 2020-10-05 DIAGNOSIS — J069 Acute upper respiratory infection, unspecified: Secondary | ICD-10-CM | POA: Diagnosis not present

## 2020-10-05 LAB — POC INFLUENZA A AND B ANTIGEN (URGENT CARE ONLY)
Influenza A Ag: NEGATIVE
Influenza B Ag: NEGATIVE

## 2020-10-05 MED ORDER — ALBUTEROL SULFATE HFA 108 (90 BASE) MCG/ACT IN AERS: 1.0000 | INHALATION_SPRAY | Freq: Four times a day (QID) | RESPIRATORY_TRACT | 0 refills | Status: DC | PRN

## 2020-10-05 MED ORDER — PROMETHAZINE-DM 6.25-15 MG/5ML PO SYRP: 5.0000 mL | ORAL_SOLUTION | Freq: Three times a day (TID) | ORAL | 0 refills | Status: DC | PRN

## 2020-10-05 MED ORDER — PREDNISONE 20 MG PO TABS: 40.0000 mg | ORAL_TABLET | Freq: Every day | ORAL | 0 refills | Status: AC

## 2020-10-05 NOTE — Discharge Instructions (Signed)
Do not take any NSAIDs with prednisone including asiprin, ibuprofen, naproxen with this medication. Please drink plenty of fluids. If anything worsens please come back to see [place].

## 2020-10-05 NOTE — ED Triage Notes (Signed)
Pt presents with cough, fatigue, shortness of breath, and congestion since yesterday.  Pt states daughter has flu

## 2020-10-05 NOTE — ED Provider Notes (Signed)
[facility]    CSN: [id number] Arrival date & time: 10/05/20  1241      History   Chief Complaint Chief Complaint  Patient presents with  . APPOINTMENT : URI     HPI Margaret Alvarado is a 35 y.o. female.   Margaret Alvarado presents today with a 2 day history of cough. She reports associated nasal congestion, sore throat, chest discomfort with coughing described as burning, sinus pressure, headache, fever with Tmax 101 yesterday. She denies any shortness of breath, nausea, vomiting, lightheadedness. Her daughter tested positive for Flu on Friday 10/01/2020. She has tried over the counter cold/flu medication without improvement in symptoms. She denies history of asthma or COPD. She did have an albuterol inhaler in the past due to recurrent bronchitis when she smoked; she quit smoking several years ago. She denies any history of allergies. She has not had flu or COVID vaccines. Reports she had COVID in December 2021 and fully recovered. She denies any history of diabetes.      Past Medical History:  Diagnosis Date  . No pertinent past medical history     Patient Active Problem List   Diagnosis Date Noted  . Anxiety 05/28/2020  . Appendicitis, acute 05/23/2011  . S/P laparoscopic appendectomy 05/23/2011    Past Surgical History:  Procedure Laterality Date  . APPENDECTOMY     05/23/2011  . CESAREAN SECTION    . LAPAROSCOPIC APPENDECTOMY  05/23/2011   Procedure: APPENDECTOMY LAPAROSCOPIC;  Surgeon: Almond Lint, MD;  Location: [facility] OR;  Service: General;  Laterality: N/A;  . TUBAL LIGATION      OB History   No obstetric history on file.      Home Medications    Prior to Admission medications   Medication Sig Start Date End Date Taking? Authorizing Provider  predniSONE (DELTASONE) 20 MG tablet Take 2 tablets (40 mg total) by mouth daily with breakfast for 4 days. 10/05/20 10/09/20 Yes Macall Mccroskey K, PA-C  promethazine-dextromethorphan (PROMETHAZINE-DM) 6.25-15  MG/5ML syrup Take 5 mLs by mouth 3 (three) times daily as needed for cough. 10/05/20  Yes Romney Compean, Noberto Retort, PA-C  acetaminophen (TYLENOL) 325 MG tablet Take 1,000 mg by mouth every 6 (six) hours as needed.     [provider]  albuterol (VENTOLIN HFA) 108 (90 Base) MCG/ACT inhaler Inhale 1-2 puffs into the lungs every 6 (six) hours as needed for wheezing or shortness of breath. 10/05/20   Gregg Holster, Noberto Retort, PA-C  ALPRAZolam (XANAX) 0.5 MG tablet Take 1 tablet (0.5 mg total) by mouth 2 (two) times daily as needed for anxiety. 08/13/20   Mozingo, Thereasa Solo, NP  buPROPion (WELLBUTRIN XL) 150 MG 24 hr tablet Take 150 mg by mouth every morning. 06/23/19   [provider]  fluticasone (FLONASE) 50 MCG/ACT nasal spray Place 1-2 sprays into both nostrils daily for 7 days. 08/18/19 08/25/19  Wieters, Hallie C, PA-C  Pseudoephedrine-guaiFENesin (MUCINEX D PO) Take by mouth.    [provider]  sertraline (ZOLOFT) 100 MG tablet Take 1 tablet (100 mg total) by mouth daily. 09/09/20   Mozingo, Thereasa Solo, NP    Family History Family History  Problem Relation Age of Onset  . Hypertension Father   . Heart disease Father     Social History Social History   Tobacco Use  . Smoking status: Current Every Day Smoker    Packs/day: 0.50    Years: 12.00    Pack years: 6.00    Types: Cigarettes  .  Smokeless tobacco: Never Used  . Tobacco comment: Smoking consult requested  Substance Use Topics  . Alcohol use: Yes    Comment: occassional  . Drug use: No     Allergies   Patient has no known allergies.   Review of Systems Review of Systems  Constitutional: Positive for activity change, fatigue and fever. Negative for appetite change.  HENT: Positive for congestion, sinus pressure and sore throat. Negative for sneezing.   Respiratory: Positive for cough, chest tightness and shortness of breath.   Cardiovascular: Negative for chest pain.  Gastrointestinal: Negative for  abdominal pain, diarrhea, nausea and vomiting.  Musculoskeletal: Negative for arthralgias and myalgias.  Neurological: Positive for headaches. Negative for dizziness and light-headedness.     Physical Exam Triage Vital Signs ED Triage Vitals [10/05/20 1325]  Enc Vitals Group     BP 107/72     Pulse Rate 98     Resp 16     Temp 99.4 F (37.4 C)     Temp Source Oral     SpO2 98 %     Weight      Height      Head Circumference      Peak Flow      Pain Score 6     Pain Loc      Pain Edu?      Excl. in GC?    No data found.  Updated Vital Signs BP 107/72 (BP Location: Left Arm)   Pulse 98   Temp 99.4 F (37.4 C) (Oral)   Resp 16   LMP 09/14/2020   SpO2 98%   Visual Acuity Right Eye Distance:   Left Eye Distance:   Bilateral Distance:    Right Eye Near:   Left Eye Near:    Bilateral Near:     Physical Exam Vitals reviewed.  Constitutional:      General: She is awake. She is not in acute distress.    Appearance: Normal appearance. She is not ill-appearing.     Comments: Very pleasant female appears stated age in no acute distress.   HENT:     Head: Normocephalic and atraumatic.     Right Ear: Tympanic membrane, ear canal and external ear normal. Tympanic membrane is not erythematous or bulging.     Left Ear: Tympanic membrane, ear canal and external ear normal. Tympanic membrane is not erythematous or bulging.     Nose:     Right Sinus: No maxillary sinus tenderness or frontal sinus tenderness.     Left Sinus: No maxillary sinus tenderness or frontal sinus tenderness.     Mouth/Throat:     Pharynx: Uvula midline. No oropharyngeal exudate or posterior oropharyngeal erythema.     Comments: Drainage present in posterior oropharynx.  Cardiovascular:     Rate and Rhythm: Normal rate and regular rhythm.     Heart sounds: No murmur heard.   Pulmonary:     Effort: Pulmonary effort is normal.     Breath sounds: Normal breath sounds. No wheezing, rhonchi or rales.      Comments: Clear to auscultation bilaterally; reactive cough with deep breathing.  Musculoskeletal:     Right lower leg: No edema.     Left lower leg: No edema.  Lymphadenopathy:     Head:     Right side of head: No submental, submandibular or tonsillar adenopathy.     Left side of head: No submental, submandibular or tonsillar adenopathy.     Cervical: No cervical  adenopathy.  Psychiatric:        Behavior: Behavior is cooperative.      UC Treatments / Results  Labs (all labs ordered are listed, but only abnormal results are displayed) Labs Reviewed  POC INFLUENZA A AND B ANTIGEN (URGENT CARE ONLY)    EKG   Radiology No results found.  Procedures Procedures (including critical care time)  Medications Ordered in UC Medications - No data to display  Initial Impression / Assessment and Plan / UC Course  I have reviewed the triage vital signs and the nursing notes.  Pertinent labs & imaging results that were available during my care of the patient were reviewed by me and considered in my medical decision making (see chart for details).     Flu was negative in office but suspect this is false negative given exposure and symptoms. Discussed potential utility of repeat COVID testing since it has been more than 90 days since she was positive but patient did not think this was neccessary. No evidence of acute infection on exam that would warrant antibiotics. Patient was given prednisone burst with instruction not to take over the counter NSAIDs with this medication. She was given a refill of albuterol to be used as needed. She was prescribed promethazine DM with instruction not to drive or drink alcohol with this medication as drowsiness is a common side effect. She was provided work excuse note today. Strict return precautions given to which patient expressed understanding.   Final Clinical Impressions(s) / UC Diagnoses   Final diagnoses:  Upper respiratory tract infection,  unspecified type     Discharge Instructions     Do not take any NSAIDs with prednisone including asiprin, ibuprofen, naproxen with this medication. Please drink plenty of fluids. If anything worsens please come back to see [place].     ED Prescriptions    Medication Sig Dispense Auth. Provider   albuterol (VENTOLIN HFA) 108 (90 Base) MCG/ACT inhaler Inhale 1-2 puffs into the lungs every 6 (six) hours as needed for wheezing or shortness of breath. 8 g Harnoor Kohles K, PA-C   promethazine-dextromethorphan (PROMETHAZINE-DM) 6.25-15 MG/5ML syrup Take 5 mLs by mouth 3 (three) times daily as needed for cough. 118 mL Nollan Muldrow K, PA-C   predniSONE (DELTASONE) 20 MG tablet Take 2 tablets (40 mg total) by mouth daily with breakfast for 4 days. 8 tablet Jurney Overacker, Noberto Retort, PA-C     PDMP not reviewed this encounter.   Jeani Hawking, PA-C 10/05/20 1449

## 2020-10-07 ENCOUNTER — Telehealth: Payer: 59 | Admitting: Physician Assistant

## 2020-10-07 DIAGNOSIS — J209 Acute bronchitis, unspecified: Secondary | ICD-10-CM

## 2020-10-07 MED ORDER — AZITHROMYCIN 250 MG PO TABS: ORAL_TABLET | ORAL | 0 refills | Status: DC

## 2020-10-07 NOTE — Progress Notes (Signed)
I have spent 5 minutes in review of e-visit questionnaire, review and updating patient chart, medical decision making and response to patient.   Emnet Monk Cody Marilouise Densmore, PA-C    

## 2020-10-07 NOTE — Progress Notes (Signed)
We are sorry that you are not feeling well.  Here is how we plan to help!  Based on your presentation I believe you most likely have A cough due to bacteria.  When patients have a fever and a productive cough with a change in color or increased sputum production, we are concerned about bacterial bronchitis.  If left untreated it can progress to pneumonia.  If your symptoms do not improve with your treatment plan it is important that you contact your provider.   I have prescribed Azithromyin 250 mg: two tablets now and then one tablet daily for 4 additonal days    In addition you may use A non-prescription cough medication called Robitussin DAC. Take 2 teaspoons every 8 hours or Delsym: take 2 teaspoons every 12 hours.    From your responses in the eVisit questionnaire you describe inflammation in the upper respiratory tract which is causing a significant cough.  This is commonly called Bronchitis and has four common causes:    Allergies  Viral Infections  Acid Reflux  Bacterial Infection Allergies, viruses and acid reflux are treated by controlling symptoms or eliminating the cause. An example might be a cough caused by taking certain blood pressure medications. You stop the cough by changing the medication. Another example might be a cough caused by acid reflux. Controlling the reflux helps control the cough.  USE OF BRONCHODILATOR ("RESCUE") INHALERS: There is a risk from using your bronchodilator too frequently.  The risk is that over-reliance on a medication which only relaxes the muscles surrounding the breathing tubes can reduce the effectiveness of medications prescribed to reduce swelling and congestion of the tubes themselves.  Although you feel brief relief from the bronchodilator inhaler, your asthma may actually be worsening with the tubes becoming more swollen and filled with mucus.  This can delay other crucial treatments, such as oral steroid medications. If you need to use a  bronchodilator inhaler daily, several times per day, you should discuss this with your provider.  There are probably better treatments that could be used to keep your asthma under control.     HOME CARE . Only take medications as instructed by your medical team. . Complete the entire course of an antibiotic. . Drink plenty of fluids and get plenty of rest. . Avoid close contacts especially the very young and the elderly . Cover your mouth if you cough or cough into your sleeve. . Always remember to wash your hands . A steam or ultrasonic humidifier can help congestion.   GET HELP RIGHT AWAY IF: . You develop worsening fever. . You become short of breath . You cough up blood. . Your symptoms persist after you have completed your treatment plan MAKE SURE YOU   Understand these instructions.  Will watch your condition.  Will get help right away if you are not doing well or get worse.  Your e-visit answers were reviewed by a board certified advanced clinical practitioner to complete your personal care plan.  Depending on the condition, your plan could have included both over the counter or prescription medications. If there is a problem please reply  once you have received a response from your provider. Your safety is important to us.  If you have drug allergies check your prescription carefully.    You can use MyChart to ask questions about today's visit, request a non-urgent call back, or ask for a work or school excuse for 24 hours related to this e-Visit. If it has   been greater than 24 hours you will need to follow up with your provider, or enter a new e-Visit to address those concerns. You will get an e-mail in the next two days asking about your experience.  I hope that your e-visit has been valuable and will speed your recovery. Thank you for using e-visits.   

## 2020-10-12 ENCOUNTER — Other Ambulatory Visit (HOSPITAL_BASED_OUTPATIENT_CLINIC_OR_DEPARTMENT_OTHER)
Admission: RE | Admit: 2020-10-12 | Discharge: 2020-10-12 | Disposition: A | Discharge status: Home / Self Care | Payer: 59 | Source: Ambulatory Visit | Attending: Nurse Practitioner | Admitting: Nurse Practitioner

## 2020-10-12 ENCOUNTER — Encounter (HOSPITAL_BASED_OUTPATIENT_CLINIC_OR_DEPARTMENT_OTHER): Payer: Self-pay | Admitting: Nurse Practitioner

## 2020-10-12 ENCOUNTER — Ambulatory Visit (INDEPENDENT_AMBULATORY_CARE_PROVIDER_SITE_OTHER): Payer: 59 | Admitting: Adult Health

## 2020-10-12 ENCOUNTER — Encounter: Payer: Self-pay | Admitting: Adult Health

## 2020-10-12 ENCOUNTER — Ambulatory Visit (HOSPITAL_BASED_OUTPATIENT_CLINIC_OR_DEPARTMENT_OTHER): Payer: 59 | Admitting: Nurse Practitioner

## 2020-10-12 ENCOUNTER — Other Ambulatory Visit: Payer: Self-pay

## 2020-10-12 VITALS — BP 101/75 | HR 95 | Ht 67.0 in | Wt 153.0 lb

## 2020-10-12 DIAGNOSIS — F422 Mixed obsessional thoughts and acts: Secondary | ICD-10-CM

## 2020-10-12 DIAGNOSIS — R002 Palpitations: Secondary | ICD-10-CM

## 2020-10-12 DIAGNOSIS — F411 Generalized anxiety disorder: Secondary | ICD-10-CM

## 2020-10-12 DIAGNOSIS — F909 Attention-deficit hyperactivity disorder, unspecified type: Secondary | ICD-10-CM

## 2020-10-12 DIAGNOSIS — R5383 Other fatigue: Secondary | ICD-10-CM | POA: Diagnosis not present

## 2020-10-12 DIAGNOSIS — Z0000 Encounter for general adult medical examination without abnormal findings: Secondary | ICD-10-CM | POA: Diagnosis present

## 2020-10-12 DIAGNOSIS — F331 Major depressive disorder, recurrent, moderate: Secondary | ICD-10-CM

## 2020-10-12 DIAGNOSIS — L659 Nonscarring hair loss, unspecified: Secondary | ICD-10-CM | POA: Insufficient documentation

## 2020-10-12 DIAGNOSIS — Z7689 Persons encountering health services in other specified circumstances: Secondary | ICD-10-CM | POA: Diagnosis not present

## 2020-10-12 DIAGNOSIS — J329 Chronic sinusitis, unspecified: Secondary | ICD-10-CM

## 2020-10-12 DIAGNOSIS — N393 Stress incontinence (female) (male): Secondary | ICD-10-CM

## 2020-10-12 DIAGNOSIS — F4310 Post-traumatic stress disorder, unspecified: Secondary | ICD-10-CM | POA: Diagnosis not present

## 2020-10-12 DIAGNOSIS — R635 Abnormal weight gain: Secondary | ICD-10-CM | POA: Diagnosis not present

## 2020-10-12 DIAGNOSIS — N6001 Solitary cyst of right breast: Secondary | ICD-10-CM

## 2020-10-12 DIAGNOSIS — J40 Bronchitis, not specified as acute or chronic: Secondary | ICD-10-CM

## 2020-10-12 LAB — CBC WITH DIFFERENTIAL/PLATELET
Abs Immature Granulocytes: 0.08 10*3/uL — ABNORMAL HIGH (ref 0.00–0.07)
Basophils Absolute: 0 10*3/uL (ref 0.0–0.1)
Basophils Relative: 0 %
Eosinophils Absolute: 0.3 10*3/uL (ref 0.0–0.5)
Eosinophils Relative: 3 %
HCT: 41.5 % (ref 36.0–46.0)
Hemoglobin: 13.7 g/dL (ref 12.0–15.0)
Immature Granulocytes: 1 %
Lymphocytes Relative: 24 %
Lymphs Abs: 2.1 10*3/uL (ref 0.7–4.0)
MCH: 31.4 pg (ref 26.0–34.0)
MCHC: 33 g/dL (ref 30.0–36.0)
MCV: 95 fL (ref 80.0–100.0)
Monocytes Absolute: 0.6 10*3/uL (ref 0.1–1.0)
Monocytes Relative: 7 %
Neutro Abs: 5.7 10*3/uL (ref 1.7–7.7)
Neutrophils Relative %: 65 %
Platelets: 229 10*3/uL (ref 150–400)
RBC: 4.37 MIL/uL (ref 3.87–5.11)
RDW: 11.7 % (ref 11.5–15.5)
WBC: 8.7 10*3/uL (ref 4.0–10.5)
nRBC: 0 % (ref 0.0–0.2)

## 2020-10-12 LAB — COMPREHENSIVE METABOLIC PANEL
ALT: 26 U/L (ref 0–44)
AST: 21 U/L (ref 15–41)
Albumin: 4.1 g/dL (ref 3.5–5.0)
Alkaline Phosphatase: 59 U/L (ref 38–126)
Anion gap: 7 (ref 5–15)
BUN: 15 mg/dL (ref 6–20)
CO2: 26 mmol/L (ref 22–32)
Calcium: 8.9 mg/dL (ref 8.9–10.3)
Chloride: 105 mmol/L (ref 98–111)
Creatinine, Ser: 0.67 mg/dL (ref 0.44–1.00)
GFR, Estimated: >60 mL/min (ref >60)
Glucose, Bld: 88 mg/dL (ref 70–99)
Potassium: 4.4 mmol/L (ref 3.5–5.1)
Sodium: 138 mmol/L (ref 135–145)
Total Bilirubin: 0.4 mg/dL (ref 0.3–1.2)
Total Protein: 6.8 g/dL (ref 6.5–8.1)

## 2020-10-12 MED ORDER — METHYLPHENIDATE HCL ER (OSM) 18 MG PO TBCR: 18.0000 mg | EXTENDED_RELEASE_TABLET | Freq: Every day | ORAL | 0 refills | Status: DC

## 2020-10-12 MED ORDER — ALPRAZOLAM 0.5 MG PO TABS: 0.5000 mg | ORAL_TABLET | Freq: Two times a day (BID) | ORAL | 2 refills | Status: DC | PRN

## 2020-10-12 MED ORDER — AMOXICILLIN-POT CLAVULANATE 875-125 MG PO TABS: 1.0000 | ORAL_TABLET | Freq: Two times a day (BID) | ORAL | 0 refills | Status: DC

## 2020-10-12 NOTE — Assessment & Plan Note (Signed)
Significant hair loss over the past several months reported.  Discussed that this is often associated Post COVID, but given her additional symptoms will also check TSH today for further evaluation.  If symptoms are related to recent COVID infection, reassured the patient that this concern historically improves at about 6 months after infection with no permanent evidence of hair loss.  Will follow labs for further evaluation.

## 2020-10-12 NOTE — Assessment & Plan Note (Signed)
Symptoms and presentation consistent with sinobronchitis. Will begin treatment with Augmentin BID for 5 days to see if we can get the infection cleared after failure with azithromycin.  Discussed with patient that she may consider an over the counter allergy medication given the presence of boggy and pale turbinates and mid ear effusion- allergy symptoms may be contributing to the prolonged course of illness.  Recommend follow-up if symptoms worse or fail to improve.

## 2020-10-12 NOTE — Progress Notes (Signed)
New Patient Office Visit  Subjective:  Patient ID: Margaret Alvarado, female    DOB: 1986-01-10  Age: 35 y.o. MRN: [id number]  CC:  Chief Complaint  Patient presents with  . Establish Care    HPI Margaret Alvarado is a very pleasant 35 year old female who presents today to establish care with this practice. She reports that it has been a long time since she has been seen by primary care and has not had routine labs in quite some time. She has had routine care with OB GYN with [facility].   She endorses several generalized symptoms that are concerning for her today including anxiety, weight gain, severe fatigue, hair loss, increased thirst, leaking urine, and intermittent headaches.   She also reports a recent illness with bronchitis that has not improved.   ANXIETY She has a medical history positive for anxiety with current treatment at [facility]. She reports that her anxiety has been much worse in the past several months which led her to seek care with them. She tells me that she has a past history of substance abuse with significant trauma and PTSD associated with this period in her life, which has been presenting recently. She has also had recent significant life changing events with selling her home and moving her family with three teenagers and spouse into an apartment, building a home, and a job change. She is currently on medication and working for stabilization of her anxiety symptoms and plans to begin therapy once her anxiety is under better control. She reports that treatment is going well and she is happy with what is being done. She denies thoughts of self harm or suicide at this time.   GAD 7 : Generalized Anxiety Score 10/12/2020  Nervous, Anxious, on Edge 3  Control/stop worrying 1  Worry too much - different things 3  Trouble relaxing 2  Restless 2  Easily annoyed or irritable 2  Afraid - awful might happen 2  Total GAD 7 Score 15  Anxiety  Difficulty Very difficult   Depression screen [facility] 2/9 10/12/2020  Decreased Interest 0  Down, Depressed, Hopeless 0  PHQ - 2 Score 0  Altered sleeping 1  Tired, decreased energy 1  Change in appetite 0  Feeling bad or failure about yourself  0  Trouble concentrating 1  Moving slowly or fidgety/restless 0  Suicidal thoughts 0  PHQ-9 Score 3  Difficult doing work/chores Somewhat difficult   WEIGHT GAIN She reports that she has always been thin and not had any issues with weight management. She tells me that over the past year she has experienced about a 30 pound weight gain with no known reason. She does report her anxiety has worsened, but she is unsure if this is the cause. She reports eating a fairly healthy diet with a mix of food prepared at home and take out. She is active in running and works out on a regular basis.   FATIGUE She reports extreme fatigue that she has been dealing with for several years, but this has worsened over the past several months. She reports that she did have COVID in December and noticed a change at that time, but the fatigue was present prior to that illness. She reports that she has seen improvement in her sleep hygiene and time of rest since starting on anxiety management, but her symptoms of fatigue are not improving. She tells me that she feel tired all the time and completely  void of energy. She initially thought this was related to her mood, but it is not improving with treatment.   HAIR LOSS She tells me that since having COVID in December she has experienced an exponential amount of hair loss to the point that she has had to have her hair cut short and her hairdresser expressed concern. She states that she wakes in the morning to find hair on her pillow and when wearing her hair down she notices hair on her clothing. She states that she has always had thin/fine hair, but she has never experienced anything like this. She denies any areas of complete hair  loss on her head.   INCREASED THIRST She reports that since starting medication for her anxiety she has experienced increased thirst and dry mouth symptoms. She feels that this may be related to her medications and does not wish to stop these treatments, but would like to rule out other causes. She denies increased hunger or increased urination symptoms.   LEAKING URINE She reports that for the past year she has experienced increased urine leakage with coughing and sneezing. She states that she must wear a panty liner for protection to prevent accidental leakage onto her clothes. She does have a history of one vaginal delivery and two cesarean section deliveries. Her youngest child is 106.   INTERMITTENT HEADACHES She endorses intermittent headaches that seem to be worse when she is not wearing her glasses. She does have vision exams and works Clinical biochemist on the computer. She states that she feels that the headaches are caused by not wearing her glasses consistently.   URI She reports that she was recently seen and treated for an URI with azithromycin and prednisone burst. She states that she is still experiencing some chest tightness and cough and is concerned that infection has moved into her chest. She has completed the medications previously prescribed and reports that she did not feel the prednisone was helpful. She was given a prescription for albuterol, but did not pick up the prescription for this. She endorses ear pressure, post nasal drip, chest congestion and tightness, fatigue, and intermittent fever/chills.   Margaret Alvarado lives at home with her husband Judie Grieve and three of their four children. They are currently in the process of building a house and are temporarily living in an apartment while this is completed.  She reports feeling safe in her home and in her relationship.  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  She reports occasional alcohol  consumption with no concerns for excess and former smoking history having quit in 2020. 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  She is currently sexually active with her husband and no other partners. She has had a tubal ligation and denies any concerns for STI or need for testing today.  She reports regular menses with normal flow and 3-4 day cycles. Her LMP 09/14/2020. No signs of menopause present at this time. No vaginal symptoms present at this time. Her last pap was in November 2021 and results were normal.  She reports that she enjoys running and frequents the gym in her apartment complex. She has a fairly balanced diet with three meals a day. She reports that she tries to cook at least 4 times a week and eats leftovers for lunch the following day.   Past Medical History:  Diagnosis Date  . Appendicitis, acute 05/23/2011  . 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 APPENDECTOMY     05/23/2011  . CESAREAN SECTION    . LAPAROSCOPIC APPENDECTOMY  05/23/2011   Procedure: APPENDECTOMY LAPAROSCOPIC;  Surgeon: Almond Lint, MD;  Location: [facility] OR;  Service: General;  Laterality: N/A;  . TUBAL LIGATION      Family History  Problem Relation Age of Onset  . Hypertension Father   . Heart disease Father   . Diabetes Father   . Basal cell carcinoma Mother   . Hypertension Maternal Grandmother   . Stroke Maternal Grandmother     Social History   Socioeconomic History  . Marital status: Married    Spouse name: Judie Grieve  . Number of children: 4  . Years of education: Not on file  . Highest education level: Not on file  Occupational History  . Occupation: Clinical biochemist  Tobacco Use  . Smoking status: Former Smoker    Packs/day: 0.50    Years: 20.00    Pack years: 10.00    Types: Cigarettes    Quit date:  2020    Years since quitting: 2.2  . Smokeless tobacco: Never Used  Vaping Use  . Vaping Use: Never used  Substance and Sexual Activity  . Alcohol use: Yes    Comment: occassional  . Drug use: Not Currently  . Sexual activity: Yes    Partners: Male    Birth control/protection: Surgical  Other Topics Concern  . Not on file  Social History Narrative  . Not on file   Social Determinants of Health   Financial Resource Strain: Not on file  Food Insecurity: Not on file  Transportation Needs: Not on file  Physical Activity: Insufficiently Active  . Days of Exercise per Week: 4 days  . Minutes of Exercise per Session: 30 min  Stress: Stress Concern Present  . Feeling of Stress : Very much  Social Connections: Not on file  Intimate Partner Violence: Not At Risk  . Fear of Current or Ex-Partner: No  . Emotionally Abused: No  . Physically Abused: No  . Sexually Abused: No    ROS Review of Systems  Constitutional: Positive for chills, fatigue, fever and unexpected weight change. Negative for activity change and appetite change.  HENT: Positive for congestion, ear pain, postnasal drip, rhinorrhea and sinus pressure. Negative for sinus pain, sneezing and sore throat.   Eyes: Positive for visual disturbance.  Respiratory: Positive for cough and chest tightness. Negative for shortness of breath and wheezing.   Cardiovascular: Positive for palpitations. Negative for chest pain and leg swelling.  Gastrointestinal: Negative for abdominal pain, constipation, diarrhea, nausea and vomiting.  Endocrine: Positive for polydipsia. Negative for polyphagia and polyuria.  Genitourinary: Positive for urgency. Negative for difficulty urinating, dysuria, hematuria, menstrual problem, pelvic pain, vaginal bleeding and vaginal discharge.  Musculoskeletal: Negative for arthralgias and myalgias.  Skin: Negative for color change, pallor, rash and wound.  Neurological: Positive for headaches. Negative for  dizziness, weakness, light-headedness and numbness.  Psychiatric/Behavioral: Positive for decreased concentration, dysphoric mood and sleep disturbance. Negative for agitation, confusion, self-injury and suicidal ideas. The patient is nervous/anxious.     Objective:   Today's Vitals: BP 101/75   Pulse 95   Ht 5\' 7"  (1.702 m)   Wt 153 lb (69.4 kg)   LMP 09/14/2020   SpO2 92%   BMI 23.96  kg/m   Physical Exam Vitals and nursing note reviewed.  Constitutional:      General: She is not in acute distress.    Appearance: Normal appearance. She is well-groomed and normal weight.  HENT:     Head: Normocephalic and atraumatic.     Right Ear: Hearing, ear canal and external ear normal. A middle ear effusion is present. Tympanic membrane is bulging.     Left Ear: Hearing, ear canal and external ear normal. A middle ear effusion is present. Tympanic membrane is bulging.     Ears:     Comments: Clear effusion bilaterally with bulging of the TM present- no obvious signs of infection currently    Nose: Mucosal edema and congestion present.     Right Sinus: Maxillary sinus tenderness present. No frontal sinus tenderness.     Left Sinus: Maxillary sinus tenderness present. No frontal sinus tenderness.     Mouth/Throat:     Mouth: Mucous membranes are moist.     Pharynx: Oropharynx is clear. Posterior oropharyngeal erythema present.  Eyes:     Extraocular Movements: Extraocular movements intact.     Conjunctiva/sclera: Conjunctivae normal.     Pupils: Pupils are equal, round, and reactive to light.  Neck:     Vascular: No carotid bruit.  Cardiovascular:     Rate and Rhythm: Normal rate and regular rhythm.     Pulses: Normal pulses.     Heart sounds: Normal heart sounds. No murmur heard.   Pulmonary:     Effort: Pulmonary effort is normal.     Breath sounds: Examination of the right-middle field reveals rales. Examination of the left-middle field reveals rales. Examination of the  right-lower field reveals rales. Examination of the left-lower field reveals rales. Rales present. No wheezing or rhonchi.  Chest:  Breasts:     Right: No supraclavicular adenopathy.     Left: No supraclavicular adenopathy.    Abdominal:     General: Abdomen is flat. Bowel sounds are normal. There is no distension.     Palpations: Abdomen is soft.     Tenderness: There is no abdominal tenderness. There is no right CVA tenderness, left CVA tenderness, guarding or rebound.  Musculoskeletal:        General: Normal range of motion.     Cervical back: Normal range of motion and neck supple. No rigidity or tenderness.     Right lower leg: No edema.     Left lower leg: No edema.  Lymphadenopathy:     Head:     Right side of head: Tonsillar adenopathy present. No preauricular, posterior auricular or occipital adenopathy.     Left side of head: Tonsillar adenopathy present. No preauricular, posterior auricular or occipital adenopathy.     Cervical: Cervical adenopathy present.     Right cervical: Superficial cervical adenopathy present. No deep or posterior cervical adenopathy.    Left cervical: Superficial cervical adenopathy present. No deep or posterior cervical adenopathy.     Upper Body:     Right upper body: No supraclavicular adenopathy.     Left upper body: No supraclavicular adenopathy.  Skin:    General: Skin is warm and dry.     Capillary Refill: Capillary refill takes less than 2 seconds.  Neurological:     General: No focal deficit present.     Mental Status: She is alert and oriented to person, place, and time. Mental status is at baseline.     Cranial Nerves: No cranial nerve deficit.  Sensory: No sensory deficit.     Motor: No weakness.     Coordination: Coordination normal.     Gait: Gait normal.  Psychiatric:        Attention and Perception: Attention and perception normal.        Mood and Affect: Mood normal.        Speech: Speech normal.        Behavior:  Behavior normal.        Thought Content: Thought content normal.        Cognition and Memory: Cognition and memory normal.        Judgment: Judgment normal.     Assessment & Plan:   Problem List Items Addressed This Visit      Respiratory   Sinobronchitis    Symptoms and presentation consistent with sinobronchitis. Will begin treatment with Augmentin BID for 5 days to see if we can get the infection cleared after failure with azithromycin.  Discussed with patient that she may consider an over the counter allergy medication given the presence of boggy and pale turbinates and mid ear effusion- allergy symptoms may be contributing to the prolonged course of illness.  Recommend follow-up if symptoms worse or fail to improve.       Relevant Medications   amoxicillin-clavulanate (AUGMENTIN) 875-125 MG tablet     Other   Encounter to establish care with new doctor - Primary    New patient to establish care. Review of current and past medical history, medications, family history, and social history performed.  Discussed current treatment plans and needs.  Will request records from [facility] for evaluation. No recent PCP to obtain records from.  Patient may need health maintenance updated, but will wait on records review for further evaluation.       Hair loss    Significant hair loss over the past several months reported.  Discussed that this is often associated Post COVID, but given her additional symptoms will also check TSH today for further evaluation.  If symptoms are related to recent COVID infection, reassured the patient that this concern historically improves at about 6 months after infection with no permanent evidence of hair loss.  Will follow labs for further evaluation.       Relevant Orders   Thyroid Panel With TSH   Comprehensive metabolic panel (Completed)   Weight gain    Weight gain with no change in diet and activity in the setting of significant increased  anxiety. It is unclear at this time if her symptoms are related to increased stress or if there is another factor that could be playing a role. Given the associated symptoms of extreme fatigue, palpitations, and hair loss, we will test for thyroid dysfunction today for further evaluation.  Discussed with patient that her BMI is still WNL today.  Diet and exercise recommendations provided.  Will follow labs and make changes to the plan of care as necessary.       Relevant Orders   Thyroid Panel With TSH   Lipid panel   Fatigue    Reported severe fatigue despite improved sleep hygiene and treatment of underlying severe anxiety disorder. She has recently had COVID and then a subsequent URI, which could be contributing to her symptoms.  Will obtain labs today for CBC and Thyroid panel for further evaluation.  Will make changes to plan of care as needed based on lab results.       Relevant Orders   Thyroid  Panel With TSH   CBC with Differential/Platelet (Completed)   Comprehensive metabolic panel (Completed)   Lipid panel   Generalized anxiety disorder    Significant generalized anxiety disorder with component of PTSD [facility]  No changes to medications or plan of care today.  [facility] and to help with symptom management.  Consider addition of propranolol for palpitations if symptoms continue and no other etiology is determined.  Testing today for thyroid to rule out possible hypothyroidism cause for exacerbation of some of her symptoms.  Will make changes to the plan of care as necessary based on lab results.       Relevant Orders   Thyroid Panel With TSH   Intermittent palpitations    Intermittent palpitations with sensation of "insides shaking" in the setting of associated symptoms of weight gain, anxiety, fatigue, and intermittent constipation.  HR regular and steady today with no evidence of ectopy or  irregular rhythm. No palpitations reported during office visit.  Will obtain labs today for TSH, CBC, and CMP to evaluate for cause other than underlying anxiety symptoms.  Will determine if further work-up necessary based on the lab results and continuance of symptoms with treatment of anxiety. She may benefit from propranolol if her labs and additional findings are negative to help with underlying palpitations and anxiety.       Relevant Orders   Thyroid Panel With TSH   CBC with Differential/Platelet (Completed)   Comprehensive metabolic panel (Completed)   Laboratory tests ordered as part of a complete physical exam (CPE)    CPE performed today.  Labs obtained for basic examination and health concerns present.  Will make changes to the plan of care as appropriate based on lab results.       Relevant Orders   Thyroid Panel With TSH   CBC with Differential/Platelet (Completed)   Comprehensive metabolic panel (Completed)   Lipid panel   Stress incontinence, female    Stress incontinence in [age] year old female with history of 3 pregnancies (one vaginal delivery and 2 c-sections). Recommendations for pelvic PT discussed. Patient is interested in this.  Referral placed today.       Relevant Orders   Ambulatory referral to Physical Therapy   Benign cyst of right breast in female    Reported benign fatty cyst of the right breast detected on palpation and follow-up with mammogram in January of this year. No changes or concerns present today.  Will work to obtain medical records for evaluation.  Patient instructed to follow-up if symptoms worsen or change.          Outpatient Encounter Medications as of 10/12/2020  Medication Sig  . ALPRAZolam (XANAX) 0.5 MG tablet Take 1 tablet (0.5 mg total) by mouth 2 (two) times daily as needed for anxiety.  Marland Kitchen. amoxicillin-clavulanate (AUGMENTIN) 875-125 MG tablet Take 1 tablet by mouth 2 (two) times daily.  Marland Kitchen. buPROPion (WELLBUTRIN XL) 150 MG 24  hr tablet Take 150 mg by mouth every morning.  . methylphenidate (CONCERTA) 18 MG PO CR tablet Take 1 tablet (18 mg total) by mouth daily.  . sertraline (ZOLOFT) 100 MG tablet Take 1 tablet (100 mg total) by mouth daily.  . [DISCONTINUED] acetaminophen (TYLENOL) 325 MG tablet Take 1,000 mg by mouth every 6 (six) hours as needed.   . [DISCONTINUED] albuterol (VENTOLIN HFA) 108 (90 Base) MCG/ACT inhaler Inhale 1-2 puffs into the lungs every 6 (six)  hours as needed for wheezing or shortness of breath.  . [DISCONTINUED] azithromycin (ZITHROMAX) 250 MG tablet Take 2 tablets on Day 1. Then take 1 tablet daily.  . [DISCONTINUED] promethazine-dextromethorphan (PROMETHAZINE-DM) 6.25-15 MG/5ML syrup Take 5 mLs by mouth 3 (three) times daily as needed for cough.  . [DISCONTINUED] Pseudoephedrine-guaiFENesin (MUCINEX D PO) Take by mouth.  . [DISCONTINUED] fluticasone (FLONASE) 50 MCG/ACT nasal spray Place 1-2 sprays into both nostrils daily for 7 days.   No facility-administered encounter medications on file as of 10/12/2020.    Follow-up: Return for lab appt today- will determine additional follow-up with lab results.   Tollie Eth, NP

## 2020-10-12 NOTE — Assessment & Plan Note (Signed)
Reported severe fatigue despite improved sleep hygiene and treatment of underlying severe anxiety disorder. She has recently had COVID and then a subsequent URI, which could be contributing to her symptoms.  Will obtain labs today for CBC and Thyroid panel for further evaluation.  Will make changes to plan of care as needed based on lab results.

## 2020-10-12 NOTE — Assessment & Plan Note (Signed)
Stress incontinence in 35 year old female with history of 3 pregnancies (one vaginal delivery and 2 c-sections). Recommendations for pelvic PT discussed. Patient is interested in this.  Referral placed today.

## 2020-10-12 NOTE — Assessment & Plan Note (Signed)
Reported benign fatty cyst of the right breast detected on palpation and follow-up with mammogram in January of this year. No changes or concerns present today.  Will work to obtain medical records for evaluation.  Patient instructed to follow-up if symptoms worsen or change.

## 2020-10-12 NOTE — Progress Notes (Signed)
Margaret Alvarado [id number] Nov 13, 1985 35 y.o.  Subjective:   Patient ID:  Margaret Alvarado is a 35 y.o. (DOB Feb 09, 1986) female.  Chief Complaint: No chief complaint on file.   HPI Laury Huizar presents to the office today for follow-up of GAD, MDD, PTSD, ADHD, obsessional thoughts.  Describes mood today as "ok". Pleasant. Decreased tearfulness. Mood symptoms - reports decreased anxiety and irritability - "doing a lot better with keeping myself calm". Denies depression. Stating "I feel like the increase in Zoloft was helpful". Still wakes up "shaky" some mornings. Decreased worry. Feels like increase of Zoloft has been helpful - "my quality of life has improved over the last few months". Hair falling out - "coming out bad". Plans to see PCP this morning for evaluation.  Has returned to work. Stable interest and motivation. Taking medications as prescribed.  Energy levels lower. Active, has a regular exercise routine.   Enjoys some usual interests and activities. Married. Lives with her husband and 4 children - teenagers. Family local. Spending time with family. Appetite adequate.  Weight stable - 150 pounds. Sleeps well most nights. Averages 8 hours. Not getting up 5 to 6 times to check on things. Focus and concentration difficulties - "it's still terrible, a little better at work". Completing tasks. Managing aspects of household. 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 Denies SI or HI.  Denies AH or VH.  Previous medication trials: Wellbutrin XL150mg , Xanax, Prozac, Zoloft   Flowsheet Row ED from 10/05/2020 in [facility] at [facility] RISK CATEGORY No Risk       Review of Systems:  Review of Systems  Musculoskeletal: Negative for gait problem.  Neurological: Negative for tremors.  Psychiatric/Behavioral:       Please refer to HPI    Medications: I have reviewed the patient's current medications.  Current Outpatient Medications   Medication Sig Dispense Refill  . methylphenidate (CONCERTA) 18 MG PO CR tablet Take 1 tablet (18 mg total) by mouth daily. 30 tablet 0  . acetaminophen (TYLENOL) 325 MG tablet Take 1,000 mg by mouth every 6 (six) hours as needed.     Marland Kitchen albuterol (VENTOLIN HFA) 108 (90 Base) MCG/ACT inhaler Inhale 1-2 puffs into the lungs every 6 (six) hours as needed for wheezing or shortness of breath. 8 g 0  . ALPRAZolam (XANAX) 0.5 MG tablet Take 1 tablet (0.5 mg total) by mouth 2 (two) times daily as needed for anxiety. 60 tablet 2  . azithromycin (ZITHROMAX) 250 MG tablet Take 2 tablets on Day 1. Then take 1 tablet daily. 6 tablet 0  . buPROPion (WELLBUTRIN XL) 150 MG 24 hr tablet Take 150 mg by mouth every morning.    . fluticasone (FLONASE) 50 MCG/ACT nasal spray Place 1-2 sprays into both nostrils daily for 7 days. 1 g 0  . promethazine-dextromethorphan (PROMETHAZINE-DM) 6.25-15 MG/5ML syrup Take 5 mLs by mouth 3 (three) times daily as needed for cough. 118 mL 0  . Pseudoephedrine-guaiFENesin (MUCINEX D PO) Take by mouth.    . sertraline (ZOLOFT) 100 MG tablet Take 1 tablet (100 mg total) by mouth daily. 30 tablet 5   No current facility-administered medications for this visit.    Medication Side Effects: None  Allergies: No Known Allergies  Past Medical History:  Diagnosis Date  . No pertinent past medical history     Family History  Problem Relation Age of Onset  . Hypertension Father   . Heart disease Father     Social  History   Socioeconomic History  . Marital status: Married    Spouse name: Not on file  . Number of children: Not on file  . Years of education: Not on file  . Highest education level: Not on file  Occupational History  . Not on file  Tobacco Use  . Smoking status: Current Every Day Smoker    Packs/day: 0.50    Years: 12.00    Pack years: 6.00    Types: Cigarettes  . Smokeless tobacco: Never Used  . Tobacco comment: Smoking consult requested  Substance and  Sexual Activity  . Alcohol use: Yes    Comment: occassional  . Drug use: No  . Sexual activity: Yes    Birth control/protection: Surgical  Other Topics Concern  . Not on file  Social History Narrative  . Not on file   Social Determinants of Health   Financial Resource Strain: Not on file  Food Insecurity: Not on file  Transportation Needs: Not on file  Physical Activity: Not on file  Stress: Not on file  Social Connections: Not on file  Intimate Partner Violence: Not on file    Past Medical History, Surgical history, Social history, and Family history were reviewed and updated as appropriate.   Please see review of systems for further details on the patient's review from today.   Objective:   Physical Exam:  LMP 09/14/2020   Physical Exam Constitutional:      General: She is not in acute distress. Musculoskeletal:        General: No deformity.  Neurological:     Mental Status: She is alert and oriented to person, place, and time.     Coordination: Coordination normal.  Psychiatric:        Attention and Perception: Attention and perception normal. She does not perceive auditory or visual hallucinations.        Mood and Affect: Mood normal. Mood is not anxious or depressed. Affect is not labile, blunt, angry or inappropriate.        Speech: Speech normal.        Behavior: Behavior normal.        Thought Content: Thought content normal. Thought content is not paranoid or delusional. Thought content does not include homicidal or suicidal ideation. Thought content does not include homicidal or suicidal plan.        Cognition and Memory: Cognition and memory normal.        Judgment: Judgment normal.     Comments: Insight intact     Lab Review:     Component Value Date/Time   NA 139 05/22/2011 2237   K 4.2 05/22/2011 2237   CL 103 05/22/2011 2237   GLUCOSE 86 05/22/2011 2237   BUN 11 05/22/2011 2237   CREATININE 0.60 05/22/2011 2237       Component Value  Date/Time   WBC 8.0 05/22/2011 2222   RBC 4.38 05/22/2011 2222   HGB 13.9 05/22/2011 2237   HCT 41.0 05/22/2011 2237   PLT 200 05/22/2011 2222   MCV 90.9 05/22/2011 2222   MCH 29.7 05/22/2011 2222   MCHC 32.7 05/22/2011 2222   RDW 12.9 05/22/2011 2222   LYMPHSABS 2.3 05/22/2011 2222   MONOABS 0.6 05/22/2011 2222   EOSABS 0.2 05/22/2011 2222   BASOSABS 0.0 05/22/2011 2222    No results found for: POCLITH, LITHIUM   No results found for: PHENYTOIN, PHENOBARB, VALPROATE, CBMZ   .res Assessment: Plan:    Plan:  PDMP reviewed  1. Wellbutrin XL 150mg  every morning - OB-GYN 2. Xanax 0.5mg  BID 3. Zoloft 100mg  daily  4. Add Concerta 18mg  daily  BP 115/74 - 88  Read and reviewed note with patient for accuracy.   RTC 4 weeks  Patient advised to contact office with any questions, adverse effects, or acute worsening in signs and symptoms.  Discussed potential benefits, risk, and side effects of benzodiazepines to include potential risk of tolerance and dependence, as well as possible drowsiness.  Advised patient not to drive if experiencing drowsiness and to take lowest possible effective dose to minimize risk of dependence and tolerance.  Diagnoses and all orders for this visit:  Major depressive disorder, recurrent episode, moderate (HCC)  Generalized anxiety disorder -     ALPRAZolam (XANAX) 0.5 MG tablet; Take 1 tablet (0.5 mg total) by mouth 2 (two) times daily as needed for anxiety.  PTSD (post-traumatic stress disorder)  Mixed obsessional thoughts and acts  Attention deficit hyperactivity disorder (ADHD), unspecified ADHD type -     methylphenidate (CONCERTA) 18 MG PO CR tablet; Take 1 tablet (18 mg total) by mouth daily.     Please see After Visit Summary for patient specific instructions.  Future Appointments  Date Time Provider Department Center  10/12/2020  9:10 AM Early, , NP [facility]    No orders of the defined types were placed in this  encounter.   -------------------------------

## 2020-10-12 NOTE — Assessment & Plan Note (Signed)
Intermittent palpitations with sensation of "insides shaking" in the setting of associated symptoms of weight gain, anxiety, fatigue, and intermittent constipation.  HR regular and steady today with no evidence of ectopy or irregular rhythm. No palpitations reported during office visit.  Will obtain labs today for TSH, CBC, and CMP to evaluate for cause other than underlying anxiety symptoms.  Will determine if further work-up necessary based on the lab results and continuance of symptoms with treatment of anxiety. She may benefit from propranolol if her labs and additional findings are negative to help with underlying palpitations and anxiety.

## 2020-10-12 NOTE — Assessment & Plan Note (Signed)
New patient to establish care. Review of current and past medical history, medications, family history, and social history performed.  Discussed current treatment plans and needs.  Will request records from [facility] for evaluation. No recent PCP to obtain records from.  Patient may need health maintenance updated, but will wait on records review for further evaluation.

## 2020-10-12 NOTE — Assessment & Plan Note (Signed)
Weight gain with no change in diet and activity in the setting of significant increased anxiety. It is unclear at this time if her symptoms are related to increased stress or if there is another factor that could be playing a role. Given the associated symptoms of extreme fatigue, palpitations, and hair loss, we will test for thyroid dysfunction today for further evaluation.  Discussed with patient that her BMI is still WNL today.  Diet and exercise recommendations provided.  Will follow labs and make changes to the plan of care as necessary.

## 2020-10-12 NOTE — Patient Instructions (Signed)
We will plan to get labs today to check to see if there is something going on with your thyroid or something else causing some of the symptoms.   We will be in touch to see if we need to make any changes to the plan of care to address any issues.   I have entered a referral for pelvic PT for leaking.   ____________________________________________________________________________ Thank you for choosing [facility] for your Primary Care needs. I am excited for the opportunity to partner with you to meet your health care goals. It was a pleasure meeting you today!  I am an Adult-Geriatric Nurse Practitioner with a background in caring for patients for more than 20 years. I received my Paediatric nurse in Nursing and my Doctor of Nursing Practice degrees at [employer]. I received additional fellowship training in primary care and sports medicine after receiving my doctorate degree. I provide primary care and sports medicine services to patients age 21 and older within this office. I am also a provider with the [facility] and the director of the APP Fellowship with [facility].  I am a [place] native, but have called the [place] area home for nearly 20 years and am proud to be a member of this community.   I am passionate about providing the best service to you through preventive medicine and supportive care. I consider you a part of the medical team and value your input. I work diligently to ensure that you are heard and your needs are met in a safe and effective manner. I want you to feel comfortable with me as your provider and want you to know that your health concerns are important to me.   For your information, our office hours are Monday- Friday 8:00 AM - 5:00 PM At this time I am not in the office on Wednesdays.  If you have questions or concerns, please call our office at [phone] or send [place] a MyChart  message and we will respond as quickly as possible.   For all urgent or time sensitive needs we ask that you please call the office to avoid delays. MyChart is not constantly monitored and replies may take up to 72 business hours.  MyChart Policy: . MyChart allows for you to see your visit notes, after visit summary, provider recommendations, lab and tests results, make an appointment, request refills, and contact your provider or the office for non-urgent questions or concerns.  . Providers are seeing patients during normal business hours and do not have built in time to review MyChart messages. We ask that you allow a minimum of 72 business hours for MyChart message responses.  . Complex MyChart concerns may require a visit. Your provider may request you schedule a virtual or in person visit to ensure we are providing the best care possible. . MyChart messages sent after 4:00 PM on Friday will not be received by the provider until Monday morning.    Lab and Test Results: . You will receive your lab and test results on MyChart as soon as they are completed and results have been sent by the lab or testing facility. Due to this service, you will receive your results BEFORE your provider.  . Please allow a minimum of 72 business hours for your provider to receive and review lab and test results and contact you about.   . Most lab and test result comments from the provider will  be sent through [place]. Your provider may recommend changes to the plan of care, follow-up visits, repeat testing, ask questions, or request an office visit to discuss these results. You may reply directly to this message or call the office at [phone] to provide information for the provider or set up an appointment. . In some instances, you will be called with test results and recommendations. Please let us know if this is preferred and we will make note of this in your chart to provide this for you.    . If you have not  heard a response to your lab or test results in 72 business hours, please call the office to let us know.   After Hours: . For all non-emergency after hours needs, please call the office at [phone] and select the option to reach the on-call provider service. On-call services are shared between multiple [place] offices and therefore it will not be possible to speak directly with your provider. On-call providers may provide medical advice and recommendations, but are unable to provide refills for maintenance medications.  . For all emergency or urgent medical needs after normal business hours, we recommend that you seek care at the closest Urgent Care or Emergency Department to ensure appropriate treatment in a timely manner.  [facility] at [place] has a 24 hour emergency room located on the ground floor for your convenience.    Please do not hesitate to reach out to [place] with concerns.   Thank you, again, for choosing me as your health care partner. I appreciate your trust and look forward to learning more about you.   Worthy Keeler, DNP, AGNP-c ___________________________________________________________________________________  Health Maintenance Recommendations Screening Testing  Mammogram  Every 1 -2 years based on history and risk factors  Starting at age 35  Pap Smear  Ages 21-39 every 3 years  Ages 67-65 every 5 years with HPV testing  More frequent testing may be required based on results and history  Colon Cancer Screening  Every 1-10 years based on test performed, risk factors, and history  Starting at age 29  Bone Density Screening  Every 2-10 years based on history  Starting at age 62 for women  Recommendations for men differ based on medication usage, history, and risk factors  AAA Screening  One time ultrasound  Men 22-18 years old who have every smoked  Lung Cancer Screening  Low Dose Lung CT every 12 months  Age 86-80 years  with a 30 pack-year smoking history who still smoke or who have quit within the last 15 years  Screening Labs  Routine  Labs: Complete Blood Count (CBC), Complete Metabolic Panel (CMP), Cholesterol (Lipid Panel)  Every 6-12 months based on history and medications  May be recommended more frequently based on current conditions or previous results  Hemoglobin A1c Lab  Every 3-12 months based on history and previous results  Starting at age 14 or earlier with diagnosis of diabetes, high cholesterol, BMI >26, and/or risk factors  Frequent monitoring for patients with diabetes to ensure blood sugar control  Thyroid Panel (TSH w/ T3 & T4)  Every 6 months based on history, symptoms, and risk factors  May be repeated more often if on medication  HIV  One time testing for all patients 53 and older  May be repeated more frequently for patients with increased risk factors or exposure  Hepatitis C  One time testing for all patients 18 and older  May be repeated more frequently  for patients with increased risk factors or exposure  Gonorrhea, Chlamydia  Every 12 months for all sexually active persons 13-24 years  Additional monitoring may be recommended for those who are considered high risk or who have symptoms  PSA  Men 73-36 years old with risk factors  Additional screening may be recommended from age 19-69 based on risk factors, symptoms, and history  Vaccine Recommendations  Tetanus Booster  All adults every 10 years  Flu Vaccine  All patients 6 months and older every year  COVID Vaccine  All patients 12 years and older  Initial dosing with booster  May recommend additional booster based on age and health history  HPV Vaccine  2 doses all patients age 61-26  Dosing may be considered for patients over 26  Shingles Vaccine (Shingrix)  2 doses all adults 60 years and older  Pneumonia (Pneumovax 23)  All adults 23 years and older  May recommend  earlier dosing based on health history  Pneumonia (Prevnar 22)  All adults 17 years and older  Dosed 1 year after Pneumovax 23  Additional Screening, Testing, and Vaccinations may be recommended on an individualized basis based on family history, health history, risk factors, and/or exposure.   I recommend that all patients maintain a diet low in saturated fats, carbohydrates, and cholesterol. While this can be challenging at first, it is not impossible and small changes can make big differences.  Things to try: Marland Kitchen Decreasing the amount of soda, sweet tea, and/or juice to one or less per day and replace with water o While water is always the first choice, if you do not like water you may consider - adding a water additive without sugar to improve the taste - other sugar free drinks . Replace potatoes with a brightly colored vegetable at dinner . Use healthy oils, such as canola oil or olive oil, instead of butter or hard margarine . Limit your bread intake to two pieces or less a day . Replace regular pasta with low carb pasta options . Bake, broil, or grill foods instead of frying . Monitor portion sizes  . Eat smaller, more frequent meals throughout the day instead of large meals  An important thing to remember is, if you love foods that are not great for your health, you don't have to give them up completely. Instead, allow these foods to be a reward when you have done well. Allowing yourself to still have special treats every once in a while is a nice way to tell yourself thank you for working hard to keep yourself healthy.   Also remember that every day is a new day. If you have a bad day and "fall off the wagon", you can still climb right back up and keep moving along on your journey!  We have resources available to help you!  Some websites that may be helpful  include: . www.http://carter.biz/  . Www.VeryWellFit.com __________________________________________________________________________________ I recommend that all adults get at least 20 minutes of moderate physical activity that elevates your heart rate at least 5 days out of the week.  Some examples include: . Walking or jogging at a pace that allows you to carry on a conversation . Cycling (stationary bike or outdoors) . Water aerobics . Yoga . Weight lifting . Dancing If physical limitations prevent you from putting stress on your joints, exercise in a pool or seated in a chair are excellent options.  Do determine your MAXIMUM heart rate for activity: YOUR AGE - 220 =  MAX HeartRate   Remember! . Do not push yourself too hard.  . Start slowly and build up your pace, speed, weight, time in exercise, etc.  . Allow your body to rest between exercise and get good sleep. . You will need more water than normal when you are exerting yourself. Do not wait until you are thirsty to drink. Drink with a purpose of getting in at least 8, 8 ounce glasses of water a day plus more depending on how much you exercise and sweat.    If you begin to develop dizziness, chest pain, abdominal pain, jaw pain, shortness of breath, headache, vision changes, lightheadedness, or other concerning symptoms, stop the activity and allow your body to rest. If your symptoms are severe, seek emergency evaluation immediately. If your symptoms are concerning, but not severe, please let us know so that we can recommend further evaluation.   I recommend that all patients maintain a diet low in saturated fats, carbohydrates, and cholesterol. While this can be challenging at first, it is not impossible and small changes can make big differences.  Things to try: Marland Kitchen Decreasing the amount of soda, sweet tea, and/or juice to one or less per day and replace with water o While water is always the first choice, if you do not like water you may  consider - adding a water additive without sugar to improve the taste - other sugar free drinks . Replace potatoes with a brightly colored vegetable at dinner . Use healthy oils, such as canola oil or olive oil, instead of butter or hard margarine . Limit your bread intake to two pieces or less a day . Replace regular pasta with low carb pasta options . Bake, broil, or grill foods instead of frying . Monitor portion sizes  . Eat smaller, more frequent meals throughout the day instead of large meals  An important thing to remember is, if you love foods that are not great for your health, you don't have to give them up completely. Instead, allow these foods to be a reward when you have done well. Allowing yourself to still have special treats every once in a while is a nice way to tell yourself thank you for working hard to keep yourself healthy.   Also remember that every day is a new day. If you have a bad day and "fall off the wagon", you can still climb right back up and keep moving along on your journey!  We have resources available to help you!  Some websites that may be helpful include: . www.http://carter.biz/  . Www.VeryWellFit.com _____________________________________________________________________________________

## 2020-10-12 NOTE — Assessment & Plan Note (Signed)
Significant generalized anxiety disorder with component of PTSD currently on treatment and followed by [employer] [facility].  No changes to medications or plan of care today.  Will follow with [facility] for patients needs and to help with symptom management.  Consider addition of propranolol for palpitations if symptoms continue and no other etiology is determined.  Testing today for thyroid to rule out possible hypothyroidism cause for exacerbation of some of her symptoms.  Will make changes to the plan of care as necessary based on lab results.

## 2020-10-12 NOTE — Assessment & Plan Note (Signed)
CPE performed today.  Labs obtained for basic examination and health concerns present.  Will make changes to the plan of care as appropriate based on lab results.

## 2020-10-13 LAB — LIPID PANEL
Cholesterol: 198 mg/dL (ref 0–200)
HDL: 67 mg/dL (ref >40)
LDL Cholesterol: 79 mg/dL (ref 0–99)
Total CHOL/HDL Ratio: 3 RATIO
Triglycerides: 260 mg/dL — ABNORMAL HIGH (ref <150)
VLDL: 52 mg/dL — ABNORMAL HIGH (ref 0–40)

## 2020-10-18 ENCOUNTER — Telehealth (HOSPITAL_BASED_OUTPATIENT_CLINIC_OR_DEPARTMENT_OTHER): Payer: Self-pay

## 2020-10-18 NOTE — Telephone Encounter (Signed)
Results released by Shawna Clamp, AGNP and reviewed by patient via MyChart.  Instructed patient to contact office with any questions or concerns.

## 2020-10-18 NOTE — Telephone Encounter (Signed)
-----   Message from Tollie Eth, NP sent at 10/18/2020  8:00 AM EDT ----- Labs normal- lipids were not fasting, therefore Triglyceride levels not consistent with fasting rates. No concerns at this time. Plan to repeat in 12 months.

## 2020-10-18 NOTE — Progress Notes (Signed)
Labs normal- lipids were not fasting, therefore Triglyceride levels not consistent with fasting rates. No concerns at this time. Plan to repeat in 12 months.

## 2020-10-20 ENCOUNTER — Other Ambulatory Visit (HOSPITAL_BASED_OUTPATIENT_CLINIC_OR_DEPARTMENT_OTHER): Payer: Self-pay | Admitting: Nurse Practitioner

## 2020-10-20 ENCOUNTER — Encounter (HOSPITAL_BASED_OUTPATIENT_CLINIC_OR_DEPARTMENT_OTHER): Payer: Self-pay | Admitting: Nurse Practitioner

## 2020-10-20 DIAGNOSIS — R093 Abnormal sputum: Secondary | ICD-10-CM

## 2020-10-20 DIAGNOSIS — R059 Cough, unspecified: Secondary | ICD-10-CM

## 2020-10-20 NOTE — Telephone Encounter (Signed)
Left voicemail for patient on mobile number to review recommendations

## 2020-10-20 NOTE — Progress Notes (Signed)
Orders placed for sputum culture and chest x-ray in the setting of increased sputum production with green/yellow drainage and ongoing cough not responsive to two rounds of antibiotics and steroid treatment.

## 2020-10-22 LAB — THYROID PANEL WITH TSH
Free Thyroxine Index: 1.8 (ref 1.2–4.9)
T3 Uptake Ratio: 24 % (ref 24–39)
T4, Total: 7.7 ug/dL (ref 4.5–12.0)
TSH: 2.72 u[IU]/mL (ref 0.450–4.500)

## 2020-10-29 ENCOUNTER — Encounter (HOSPITAL_BASED_OUTPATIENT_CLINIC_OR_DEPARTMENT_OTHER): Payer: Self-pay | Admitting: Nurse Practitioner

## 2020-11-02 ENCOUNTER — Other Ambulatory Visit (HOSPITAL_BASED_OUTPATIENT_CLINIC_OR_DEPARTMENT_OTHER): Payer: Self-pay

## 2020-11-09 ENCOUNTER — Ambulatory Visit: Payer: 59 | Admitting: Adult Health

## 2020-11-22 ENCOUNTER — Other Ambulatory Visit: Payer: Self-pay

## 2020-11-22 ENCOUNTER — Ambulatory Visit (INDEPENDENT_AMBULATORY_CARE_PROVIDER_SITE_OTHER): Payer: 59 | Admitting: Adult Health

## 2020-11-22 ENCOUNTER — Encounter: Payer: Self-pay | Admitting: Adult Health

## 2020-11-22 DIAGNOSIS — F4310 Post-traumatic stress disorder, unspecified: Secondary | ICD-10-CM | POA: Diagnosis not present

## 2020-11-22 DIAGNOSIS — F411 Generalized anxiety disorder: Secondary | ICD-10-CM | POA: Diagnosis not present

## 2020-11-22 DIAGNOSIS — F909 Attention-deficit hyperactivity disorder, unspecified type: Secondary | ICD-10-CM

## 2020-11-22 DIAGNOSIS — F331 Major depressive disorder, recurrent, moderate: Secondary | ICD-10-CM | POA: Diagnosis not present

## 2020-11-22 DIAGNOSIS — F422 Mixed obsessional thoughts and acts: Secondary | ICD-10-CM

## 2020-11-22 MED ORDER — METHYLPHENIDATE HCL ER (OSM) 27 MG PO TBCR: 27.0000 mg | EXTENDED_RELEASE_TABLET | Freq: Every day | ORAL | 0 refills | Status: DC

## 2020-11-22 MED ORDER — ALPRAZOLAM 0.5 MG PO TABS: 0.5000 mg | ORAL_TABLET | Freq: Two times a day (BID) | ORAL | 2 refills | Status: DC | PRN

## 2020-11-22 NOTE — Progress Notes (Signed)
Margaret Alvarado [id number] 08-01-1985 35 y.o.  Subjective:   Patient ID:  Margaret Alvarado is a 35 y.o. (DOB 10/07/1985) female.  Chief Complaint: No chief complaint on file.   HPI Margaret Alvarado presents to the office today for follow-up of GAD, MDD, PTSD, ADHD, obsessional thoughts.  Describes mood today as "ok". Pleasant. Decreased tearfulness. Mood symptoms - feels a little anxious at times. Denies irritability and depression. Tolerated addition of Concerta - "it helped me focus". Decreased worry - "but it's still there, just not as bad". Increased situational stressors with family members. Stable interest and motivation. Taking medications as prescribed.  Energy levels lower. Active, does not have a regular exercise routine.   Enjoys some usual interests and activities. Married. Lives with her husband and 4 children - teenagers. Family local. Spending time with family. Appetite adequate.  Weight stable - 150 pounds. Sleeps well most nights. Averages 8 hours.  Focus and concentration improved with addition of Concerta. Completing tasks. Managing aspects of household. 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 Denies SI or HI.  Denies AH or VH.     AUDIT   Flowsheet Row Office Visit from 10/12/2020 in [facility]  Alcohol Use Disorder Identification Test Final Score (AUDIT) 3    GAD-7   Flowsheet Row Office Visit from 10/12/2020 in [facility]  Total GAD-7 Score 15    PHQ2-9   Flowsheet Row Office Visit from 10/12/2020 in [facility]  PHQ-2 Total Score 0  PHQ-9 Total Score 3    Flowsheet Row ED from 10/05/2020 in [facility] RISK CATEGORY No Risk       Review of Systems:  Review of Systems  Musculoskeletal: Negative for gait problem.  Neurological: Negative for tremors.   Psychiatric/Behavioral:       Please refer to HPI    Medications: I have reviewed the patient's current medications.  Current Outpatient Medications  Medication Sig Dispense Refill  . ALPRAZolam (XANAX) 0.5 MG tablet Take 1 tablet (0.5 mg total) by mouth 2 (two) times daily as needed for anxiety. 60 tablet 2  . amoxicillin-clavulanate (AUGMENTIN) 875-125 MG tablet Take 1 tablet by mouth 2 (two) times daily. 10 tablet 0  . buPROPion (WELLBUTRIN XL) 150 MG 24 hr tablet Take 150 mg by mouth every morning.    . methylphenidate (CONCERTA) 27 MG PO CR tablet Take 1 tablet (27 mg total) by mouth daily. 30 tablet 0  . sertraline (ZOLOFT) 100 MG tablet Take 1 tablet (100 mg total) by mouth daily. 30 tablet 5   No current facility-administered medications for this visit.    Medication Side Effects: None  Allergies: No Known Allergies  Past Medical History:  Diagnosis Date  . Appendicitis, acute 05/23/2011  . 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   Please see review of systems for further details on the patient's review from today.   Objective:   Physical Exam:  There were no vitals taken for this visit.  Physical Exam Constitutional:      General: She is not in acute distress. Musculoskeletal:        General: No deformity.  Neurological:     Mental Status: She is alert and oriented to person, place, and time.  Coordination: Coordination normal.  Psychiatric:        Attention and Perception: Attention and perception normal. She does not perceive auditory or visual hallucinations.        Mood and Affect: Mood normal. Mood is not anxious or depressed. Affect is not labile, blunt, angry or inappropriate.        Speech: Speech normal.        Behavior: Behavior normal.        Thought Content: Thought content normal. Thought content is  not paranoid or delusional. Thought content does not include homicidal or suicidal ideation. Thought content does not include homicidal or suicidal plan.        Cognition and Memory: Cognition and memory normal.        Judgment: Judgment normal.     Comments: Insight intact     Lab Review:     Component Value Date/Time   NA 138 10/12/2020 1057   K 4.4 10/12/2020 1057   CL 105 10/12/2020 1057   CO2 26 10/12/2020 1057   GLUCOSE 88 10/12/2020 1057   BUN 15 10/12/2020 1057   CREATININE 0.67 10/12/2020 1057   CALCIUM 8.9 10/12/2020 1057   PROT 6.8 10/12/2020 1057   ALBUMIN 4.1 10/12/2020 1057   AST 21 10/12/2020 1057   ALT 26 10/12/2020 1057   ALKPHOS 59 10/12/2020 1057   BILITOT 0.4 10/12/2020 1057   GFRNONAA >60 10/12/2020 1057       Component Value Date/Time   WBC 8.7 10/12/2020 1057   RBC 4.37 10/12/2020 1057   HGB 13.7 10/12/2020 1057   HCT 41.5 10/12/2020 1057   PLT 229 10/12/2020 1057   MCV 95.0 10/12/2020 1057   MCH 31.4 10/12/2020 1057   MCHC 33.0 10/12/2020 1057   RDW 11.7 10/12/2020 1057   LYMPHSABS 2.1 10/12/2020 1057   MONOABS 0.6 10/12/2020 1057   EOSABS 0.3 10/12/2020 1057   BASOSABS 0.0 10/12/2020 1057    No results found for: POCLITH, LITHIUM   No results found for: PHENYTOIN, PHENOBARB, VALPROATE, CBMZ   .res Assessment: Plan:    Plan:  PDMP reviewed  1. Wellbutrin XL 150mg  every morning - OB-GYN 2. Xanax 0.5mg  BID 3. Zoloft 100mg  daily  4. Increase Concerta 18mg  to 27mg  daily  Read and reviewed note with patient for accuracy.   RTC 4 weeks  Patient advised to contact office with any questions, adverse effects, or acute worsening in signs and symptoms.  Discussed potential benefits, risk, and side effects of benzodiazepines to include potential risk of tolerance and dependence, as well as possible drowsiness.  Advised patient not to drive if experiencing drowsiness and to take lowest possible effective dose to minimize risk of dependence  and tolerance.    Diagnoses and all orders for this visit:  Major depressive disorder, recurrent episode, moderate (HCC)  Attention deficit hyperactivity disorder (ADHD), unspecified ADHD type -     methylphenidate (CONCERTA) 27 MG PO CR tablet; Take 1 tablet (27 mg total) by mouth daily.  Generalized anxiety disorder -     ALPRAZolam (XANAX) 0.5 MG tablet; Take 1 tablet (0.5 mg total) by mouth 2 (two) times daily as needed for anxiety.  PTSD (post-traumatic stress disorder)  Mixed obsessional thoughts and acts     Please see After Visit Summary for patient specific instructions.  Future Appointments  Date Time Provider Department Center  12/20/2020  8:00 AM Perrin Eddleman, , NP [facility] None    No orders of the defined types were placed  in this encounter.   -------------------------------

## 2020-12-02 ENCOUNTER — Encounter (HOSPITAL_BASED_OUTPATIENT_CLINIC_OR_DEPARTMENT_OTHER): Payer: Self-pay | Admitting: Nurse Practitioner

## 2020-12-04 NOTE — Telephone Encounter (Signed)
Attempted to contact the patient 3 times on Friday 06/03 with no answer Contacted [facility] and [facility] on behalf of the patient Instructed by both offices that if the patient is seeking in patient treatment she will need too be medically evaluated (at either ED or by NP at the Urgent Care) to ensure the patient is not suffering from withdrawals prior to be admitted If the patient is seeking outpatient treatment she can get resources and program information from [facility] Urgent care

## 2020-12-20 ENCOUNTER — Ambulatory Visit: Payer: 59 | Admitting: Adult Health

## 2020-12-27 ENCOUNTER — Ambulatory Visit: Payer: 59 | Admitting: Adult Health

## 2020-12-27 ENCOUNTER — Other Ambulatory Visit: Payer: Self-pay

## 2020-12-27 ENCOUNTER — Encounter: Payer: Self-pay | Admitting: Adult Health

## 2020-12-27 DIAGNOSIS — F4310 Post-traumatic stress disorder, unspecified: Secondary | ICD-10-CM

## 2020-12-27 DIAGNOSIS — F909 Attention-deficit hyperactivity disorder, unspecified type: Secondary | ICD-10-CM | POA: Diagnosis not present

## 2020-12-27 DIAGNOSIS — F331 Major depressive disorder, recurrent, moderate: Secondary | ICD-10-CM | POA: Diagnosis not present

## 2020-12-27 DIAGNOSIS — F411 Generalized anxiety disorder: Secondary | ICD-10-CM | POA: Diagnosis not present

## 2020-12-27 MED ORDER — SERTRALINE HCL 100 MG PO TABS: 100.0000 mg | ORAL_TABLET | Freq: Every day | ORAL | 5 refills | Status: DC

## 2020-12-27 MED ORDER — METHYLPHENIDATE HCL ER (OSM) 27 MG PO TBCR: 27.0000 mg | EXTENDED_RELEASE_TABLET | ORAL | 0 refills | Status: DC

## 2020-12-27 MED ORDER — METHYLPHENIDATE HCL ER (OSM) 27 MG PO TBCR: 27.0000 mg | EXTENDED_RELEASE_TABLET | Freq: Every day | ORAL | 0 refills | Status: DC

## 2020-12-27 MED ORDER — ALPRAZOLAM 0.5 MG PO TABS: 0.5000 mg | ORAL_TABLET | Freq: Two times a day (BID) | ORAL | 2 refills | Status: DC | PRN

## 2020-12-27 NOTE — Progress Notes (Signed)
Margaret Alvarado [id number] [birth year] 35 y.o.  Subjective:   Patient ID:  Margaret Alvarado is a 35 y.o. (DOB 1986/06/14) female.  Chief Complaint: No chief complaint on file.   HPI Brenly Trawick presents to the office today for follow-up of GAD, MDD, PTSD, ADHD, obsessional thoughts.  Describes mood today as "ok". Pleasant. Denies tearfulness. Mood symptoms - denies anxiety, irritability and depression. Denies worry and rumination. Stating "I'm doing alright". Feels like the increase in Ritalin has been helpful. Stating "I have more clarity". Stable interest and motivation. Taking medications as prescribed.  Energy levels improved. Active, does not have a regular exercise routine.   Enjoys some usual interests and activities. Married. Lives with her husband and 4 children - teenagers. Family local. Spending time with family. Appetite adequate.  Weight stable - 150 pounds. Sleeps well most nights. Averages 8 hours.  Focus and concentration improved. Completing tasks. Managing aspects of household. 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 Denies SI or HI.  Denies AH or VH.      AUDIT    Flowsheet Row Office Visit from 10/12/2020 in [facility]  Alcohol Use Disorder Identification Test Final Score (AUDIT) 3      GAD-7    Flowsheet Row Office Visit from 10/12/2020 in [facility]  Total GAD-7 Score 15      PHQ2-9    Flowsheet Row Office Visit from 10/12/2020 in [facility]  PHQ-2 Total Score 0  PHQ-9 Total Score 3      Flowsheet Row ED from 10/05/2020 in [facility] RISK CATEGORY No Risk        Review of Systems:  Review of Systems  Musculoskeletal:  Negative for gait problem.  Neurological:  Negative for tremors.  Psychiatric/Behavioral:         Please refer to HPI    Medications: I have reviewed the patient's current medications.  Current Outpatient Medications  Medication Sig Dispense Refill   [START ON 01/24/2021] methylphenidate (CONCERTA) 27 MG PO CR tablet Take 1 tablet (27 mg total) by mouth every morning. 30 tablet 0   [START ON 02/21/2021] methylphenidate (CONCERTA) 27 MG PO CR tablet Take 1 tablet (27 mg total) by mouth every morning. 30 tablet 0   ALPRAZolam (XANAX) 0.5 MG tablet Take 1 tablet (0.5 mg total) by mouth 2 (two) times daily as needed for anxiety. 60 tablet 2   amoxicillin-clavulanate (AUGMENTIN) 875-125 MG tablet Take 1 tablet by mouth 2 (two) times daily. 10 tablet 0   buPROPion (WELLBUTRIN XL) 150 MG 24 hr tablet Take 150 mg by mouth every morning.     methylphenidate (CONCERTA) 27 MG PO CR tablet Take 1 tablet (27 mg total) by mouth daily. 30 tablet 0   sertraline (ZOLOFT) 100 MG tablet Take 1 tablet (100 mg total) by mouth daily. 30 tablet 5   No current facility-administered medications for this visit.    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   Please see review of systems for further details on the patient's review from today.   Objective:   Physical Exam:  There were no vitals  taken for this visit.  Physical Exam Constitutional:      General: She is not in acute distress. Musculoskeletal:        General: No deformity.  Neurological:     Mental Status: She is alert and oriented to person, place, and time.     Coordination: Coordination normal.  Psychiatric:        Attention and Perception: Attention and perception normal. She does not perceive auditory or visual hallucinations.        Mood and Affect: Mood normal. Mood is not  anxious or depressed. Affect is not labile, blunt, angry or inappropriate.        Speech: Speech normal.        Behavior: Behavior normal.        Thought Content: Thought content normal. Thought content is not paranoid or delusional. Thought content does not include homicidal or suicidal ideation. Thought content does not include homicidal or suicidal plan.        Cognition and Memory: Cognition and memory normal.        Judgment: Judgment normal.     Comments: Insight intact    Lab Review:     Component Value Date/Time   NA 138 10/12/2020 1057   K 4.4 10/12/2020 1057   CL 105 10/12/2020 1057   CO2 26 10/12/2020 1057   GLUCOSE 88 10/12/2020 1057   BUN 15 10/12/2020 1057   CREATININE 0.67 10/12/2020 1057   CALCIUM 8.9 10/12/2020 1057   PROT 6.8 10/12/2020 1057   ALBUMIN 4.1 10/12/2020 1057   AST 21 10/12/2020 1057   ALT 26 10/12/2020 1057   ALKPHOS 59 10/12/2020 1057   BILITOT 0.4 10/12/2020 1057   GFRNONAA >60 10/12/2020 1057       Component Value Date/Time   WBC 8.7 10/12/2020 1057   RBC 4.37 10/12/2020 1057   HGB 13.7 10/12/2020 1057   HCT 41.5 10/12/2020 1057   PLT 229 10/12/2020 1057   MCV 95.0 10/12/2020 1057   MCH 31.4 10/12/2020 1057   MCHC 33.0 10/12/2020 1057   RDW 11.7 10/12/2020 1057   LYMPHSABS 2.1 10/12/2020 1057   MONOABS 0.6 10/12/2020 1057   EOSABS 0.3 10/12/2020 1057   BASOSABS 0.0 10/12/2020 1057    No results found for: POCLITH, LITHIUM   No results found for: PHENYTOIN, PHENOBARB, VALPROATE, CBMZ   .res Assessment: Plan:    Plan:  PDMP reviewed  1. Wellbutrin XL 150mg  every morning - OB-GYN 2. Xanax 0.5mg  BID 3. Zoloft 100mg  daily  4. Concerta 27mg  daily  125/91 74  Read and reviewed note with patient for accuracy.   RTC 3 months  Patient advised to contact office with any questions, adverse effects, or acute worsening in signs and symptoms.  Discussed potential benefits, risk, and side effects of benzodiazepines to include  potential risk of tolerance and dependence, as well as possible drowsiness.  Advised patient not to drive if experiencing drowsiness and to take lowest possible effective dose to minimize risk of dependence and tolerance.   Diagnoses and all orders for this visit:  PTSD (post-traumatic stress disorder)  Attention deficit hyperactivity disorder (ADHD), unspecified ADHD type -     methylphenidate (CONCERTA) 27 MG PO CR tablet; Take 1 tablet (27 mg total) by mouth daily.  Generalized anxiety disorder -     ALPRAZolam (XANAX) 0.5 MG tablet; Take 1 tablet (0.5 mg total) by mouth 2 (two) times daily as needed for anxiety.  Major depressive disorder, recurrent episode, moderate (  HCC) -     sertraline (ZOLOFT) 100 MG tablet; Take 1 tablet (100 mg total) by mouth daily. -     methylphenidate (CONCERTA) 27 MG PO CR tablet; Take 1 tablet (27 mg total) by mouth every morning. -     methylphenidate (CONCERTA) 27 MG PO CR tablet; Take 1 tablet (27 mg total) by mouth every morning.    Please see After Visit Summary for patient specific instructions.  Future Appointments  Date Time Provider Department Center  03/29/2021  4:20 PM Bralee Feldt, Thereasa Solo, NP CP-CP None     No orders of the defined types were placed in this encounter.   -------------------------------

## 2021-01-16 ENCOUNTER — Telehealth: Payer: 59 | Admitting: Orthopedic Surgery

## 2021-01-16 DIAGNOSIS — N760 Acute vaginitis: Secondary | ICD-10-CM

## 2021-01-16 MED ORDER — FLUCONAZOLE 150 MG PO TABS: 150.0000 mg | ORAL_TABLET | Freq: Once | ORAL | 0 refills | Status: AC

## 2021-01-16 NOTE — Progress Notes (Signed)

## 2021-02-05 ENCOUNTER — Telehealth: Payer: 59 | Admitting: Nurse Practitioner

## 2021-02-05 DIAGNOSIS — J069 Acute upper respiratory infection, unspecified: Secondary | ICD-10-CM | POA: Diagnosis not present

## 2021-02-05 MED ORDER — FLUTICASONE PROPIONATE 50 MCG/ACT NA SUSP: 2.0000 | Freq: Every day | NASAL | 6 refills | Status: DC

## 2021-02-05 MED ORDER — BENZONATATE 100 MG PO CAPS
100.0000 mg | ORAL_CAPSULE | Freq: Three times a day (TID) | ORAL | 0 refills | Status: DC | PRN
Start: 2021-02-05 — End: 2021-09-05

## 2021-02-05 NOTE — Progress Notes (Signed)
E-Visit for Upper Respiratory Infection   We are sorry you are not feeling well.  Here is how we plan to help!  According to the questions you answered you have only had symptoms for 3 days. During the first week of nasal congestion your symptoms are from viral or allergic causes. We encourage you to take a COVID test as this is one virus that can cause these symptoms. COVID home tests are available at most any pharmacy like [facility] or [facility].   If you test positive for COVID please let us know, or schedule a follow up with one of our video providers for treatment options.   During the first days of a virus antibiotics are not helpful, and do not prevent bacterial infections. We do not prescribe antibiotics for sinusitis symptoms prior to 7-19 days of symptoms without improvement with over the counter medications.   Based on what you have shared with me, it looks like you may have a viral upper respiratory infection.  Upper respiratory infections are caused by a large number of viruses; however, rhinovirus is the most common cause.   Symptoms vary from person to person, with common symptoms including sore throat, cough, fatigue or lack of energy and feeling of general discomfort.  A low-grade fever of up to 100.4 may present, but is often uncommon.  Symptoms vary however, and are closely related to a person's age or underlying illnesses.  The most common symptoms associated with an upper respiratory infection are nasal discharge or congestion, cough, sneezing, headache and pressure in the ears and face.  These symptoms usually persist for about 3 to 10 days, but can last up to 2 weeks.  It is important to know that upper respiratory infections do not cause serious illness or complications in most cases.    Upper respiratory infections can be transmitted from person to person, with the most common method of transmission being a person's hands.  The virus is able to live on the skin and can infect other  persons for up to 2 hours after direct contact.  Also, these can be transmitted when someone coughs or sneezes; thus, it is important to cover the mouth to reduce this risk.  To keep the spread of the illness at bay, good hand hygiene is very important.  This is an infection that is most likely caused by a virus. There are no specific treatments other than to help you with the symptoms until the infection runs its course.  We are sorry you are not feeling well.  Here is how we plan to help!   For nasal congestion, you may use an oral decongestants such as Mucinex D or if you have glaucoma or high blood pressure use plain Mucinex.  Saline nasal spray or nasal drops can help and can safely be used as often as needed for congestion.  For your congestion, I have prescribed Fluticasone nasal spray one spray in each nostril twice a day  If you do not have a history of heart disease, hypertension, diabetes or thyroid disease, prostate/bladder issues or glaucoma, you may also use Sudafed to treat nasal congestion.  It is highly recommended that you consult with a pharmacist or your primary care physician to ensure this medication is safe for you to take.     If you have a cough, you may use cough suppressants such as Delsym and Robitussin.  If you have glaucoma or high blood pressure, you can also use Coricidin HBP.  For cough I have prescribed for you A prescription cough medication called Tessalon Perles 100 mg. You may take 1-2 capsules every 8 hours as needed for cough  If you have a sore or scratchy throat, use a saltwater gargle-  to  teaspoon of salt dissolved in a 4-ounce to 8-ounce glass of warm water.  Gargle the solution for approximately 15-30 seconds and then spit.  It is important not to swallow the solution.  You can also use throat lozenges/cough drops and Chloraseptic spray to help with throat pain or discomfort.  Warm or cold liquids can also be helpful in relieving throat pain.  For  headache, pain or general discomfort, you can use Ibuprofen or Tylenol as directed.   Some authorities believe that zinc sprays or the use of Echinacea may shorten the course of your symptoms.   HOME CARE Only take medications as instructed by your medical team. Be sure to drink plenty of fluids. Water is fine as well as fruit juices, sodas and electrolyte beverages. You may want to stay away from caffeine or alcohol. If you are nauseated, try taking small sips of liquids. How do you know if you are getting enough fluid? Your urine should be a pale yellow or almost colorless. Get rest. Taking a steamy shower or using a humidifier may help nasal congestion and ease sore throat pain. You can place a towel over your head and breathe in the steam from hot water coming from a faucet. Using a saline nasal spray works much the same way. Cough drops, hard candies and sore throat lozenges may ease your cough. Avoid close contacts especially the very young and the elderly Cover your mouth if you cough or sneeze Always remember to wash your hands.   GET HELP RIGHT AWAY IF: You develop worsening fever. If your symptoms do not improve within 10 days You develop yellow or green discharge from your nose over 3 days. You have coughing fits You develop a severe head ache or visual changes. You develop shortness of breath, difficulty breathing or start having chest pain Your symptoms persist after you have completed your treatment plan  MAKE SURE YOU  Understand these instructions. Will watch your condition. Will get help right away if you are not doing well or get worse.  Thank you for choosing an e-visit.  Your e-visit answers were reviewed by a board certified advanced clinical practitioner to complete your personal care plan. Depending upon the condition, your plan could have included both over the counter or prescription medications.  Please review your pharmacy choice. Make sure the pharmacy is  open so you can pick up prescription now. If there is a problem, you may contact your provider through [employer] and have the prescription routed to another pharmacy.  Your safety is important to [place]. If you have drug allergies check your prescription carefully.   For the next 24 hours you can use MyChart to ask questions about today's visit, request a non-urgent call back, or ask for a work or school excuse. You will get an email in the next two days asking about your experience. I hope that your e-visit has been valuable and will speed your recovery. I spent approximately 7 minutes reviewing the patient's history, current symptoms and coordinating their plan of care today.     Meds ordered this encounter  Medications   fluticasone (FLONASE) 50 MCG/ACT nasal spray    Sig: Place 2 sprays into both nostrils daily.  Dispense:  16 g    Refill:  6   benzonatate (TESSALON) 100 MG capsule    Sig: Take 1 capsule (100 mg total) by mouth 3 (three) times daily as needed for cough.    Dispense:  30 capsule    Refill:  0

## 2021-02-08 ENCOUNTER — Telehealth: Payer: Self-pay | Admitting: Adult Health

## 2021-02-08 ENCOUNTER — Other Ambulatory Visit: Payer: Self-pay

## 2021-02-08 DIAGNOSIS — F411 Generalized anxiety disorder: Secondary | ICD-10-CM

## 2021-02-08 DIAGNOSIS — F909 Attention-deficit hyperactivity disorder, unspecified type: Secondary | ICD-10-CM

## 2021-02-08 NOTE — Telephone Encounter (Signed)
Next visit is 03/29/21. Requesting refill on Concerta and Xanax called to [facility], [address], [place], [place] [address]. Phone number is [phone]. This is her new pharmacy.  Please delete the following pharmacy from her chart.   [id number] [name], [place] - [address]  Phone:  [phone]  Fax:  [phone]

## 2021-02-08 NOTE — Telephone Encounter (Signed)
Pended.

## 2021-02-14 MED ORDER — ALPRAZOLAM 0.5 MG PO TABS: 0.5000 mg | ORAL_TABLET | Freq: Two times a day (BID) | ORAL | 0 refills | Status: DC | PRN

## 2021-02-14 MED ORDER — METHYLPHENIDATE HCL ER (OSM) 27 MG PO TBCR: 27.0000 mg | EXTENDED_RELEASE_TABLET | Freq: Every day | ORAL | 0 refills | Status: DC

## 2021-03-12 ENCOUNTER — Other Ambulatory Visit: Payer: Self-pay | Admitting: Adult Health

## 2021-03-12 DIAGNOSIS — F411 Generalized anxiety disorder: Secondary | ICD-10-CM

## 2021-03-14 NOTE — Telephone Encounter (Signed)
Last filled 8/8 appt on 9/27

## 2021-03-29 ENCOUNTER — Ambulatory Visit: Payer: 59 | Admitting: Adult Health

## 2021-03-30 ENCOUNTER — Encounter (HOSPITAL_BASED_OUTPATIENT_CLINIC_OR_DEPARTMENT_OTHER): Payer: Self-pay | Admitting: Nurse Practitioner

## 2021-04-04 ENCOUNTER — Telehealth: Payer: 59 | Admitting: Emergency Medicine

## 2021-04-04 DIAGNOSIS — J329 Chronic sinusitis, unspecified: Secondary | ICD-10-CM

## 2021-04-04 MED ORDER — AMOXICILLIN-POT CLAVULANATE 875-125 MG PO TABS: 1.0000 | ORAL_TABLET | Freq: Two times a day (BID) | ORAL | 0 refills | Status: DC

## 2021-04-04 NOTE — Progress Notes (Signed)
Virtual Visit Consent   Margaret Alvarado, you are scheduled for a virtual visit with a [facility] provider today.     Just as with appointments in the office, your consent must be obtained to participate.  Your consent will be active for this visit and any virtual visit you may have with one of our providers in the next 365 days.     If you have a MyChart account, a copy of this consent can be sent to you electronically.  All virtual visits are billed to your insurance company just like a traditional visit in the office.    As this is a virtual visit, video technology does not allow for your provider to perform a traditional examination.  This may limit your provider's ability to fully assess your condition.  If your provider identifies any concerns that need to be evaluated in person or the need to arrange testing (such as labs, EKG, etc.), we will make arrangements to do so.     Although advances in technology are sophisticated, we cannot ensure that it will always work on either your end or our end.  If the connection with a video visit is poor, the visit may have to be switched to a telephone visit.  With either a video or telephone visit, we are not always able to ensure that we have a secure connection.     I need to obtain your verbal consent now.   Are you willing to proceed with your visit today?    Margaret Alvarado has provided verbal consent on 04/04/2021 for a virtual visit video.   Roxy Horseman, PA-C   Date: 04/04/2021 12:14 PM   Virtual Visit via Video Note   I, Roxy Horseman, connected with  Margaret Alvarado  ([id number], Nov 13, 1985) on 04/04/21 at 12:15 PM EDT by a video-enabled telemedicine application and verified that I am speaking with the correct person using two identifiers.  Location: Patient: Virtual Visit Location Patient: Home Provider: Virtual Visit Location Provider: Home Office   I discussed the limitations of evaluation and management by  telemedicine and the availability of in person appointments. The patient expressed understanding and agreed to proceed.    History of Present Illness: Margaret Alvarado is a 35 y.o. who identifies as a female who was assigned female at birth, and is being seen today for chief complaint of headache, fatigue, ear pain.  States that she has been sick for a week.  States that she also feels congested.  Denies fevers, but states that she has chills.  Had negative home COVID test today.    HPI: HPI  Problems:  Patient Active Problem List   Diagnosis Date Noted   Encounter to establish care with new doctor 10/12/2020   Hair loss 10/12/2020   Weight gain 10/12/2020   Fatigue 10/12/2020   Generalized anxiety disorder 10/12/2020   Intermittent palpitations 10/12/2020   Laboratory tests ordered as part of a complete physical exam (CPE) 10/12/2020   Stress incontinence, female 10/12/2020   Sinobronchitis 10/12/2020   Benign cyst of right breast in female 07/14/2020   Anxiety 05/28/2020   S/P laparoscopic appendectomy 05/23/2011    Allergies: No Known Allergies Medications:  Current Outpatient Medications:    ALPRAZolam (XANAX) 0.5 MG tablet, TAKE 1 TABLET BY MOUTH 2 TIMES DAILY AS NEEDED FOR ANXIETY., Disp: 60 tablet, Rfl: 0   amoxicillin-clavulanate (AUGMENTIN) 875-125 MG tablet, Take 1 tablet by mouth 2 (two) times daily., Disp: 10 tablet, Rfl: 0   benzonatate (  TESSALON) 100 MG capsule, Take 1 capsule (100 mg total) by mouth 3 (three) times daily as needed for cough., Disp: 30 capsule, Rfl: 0   buPROPion (WELLBUTRIN XL) 150 MG 24 hr tablet, Take 150 mg by mouth every morning., Disp: , Rfl:    fluticasone (FLONASE) 50 MCG/ACT nasal spray, Place 2 sprays into both nostrils daily., Disp: 16 g, Rfl: 6   methylphenidate (CONCERTA) 27 MG PO CR tablet, Take 1 tablet (27 mg total) by mouth every morning., Disp: 30 tablet, Rfl: 0   methylphenidate (CONCERTA) 27 MG PO CR tablet, Take 1 tablet (27 mg  total) by mouth every morning., Disp: 30 tablet, Rfl: 0   methylphenidate (CONCERTA) 27 MG PO CR tablet, Take 1 tablet (27 mg total) by mouth daily., Disp: 30 tablet, Rfl: 0   sertraline (ZOLOFT) 100 MG tablet, Take 1 tablet (100 mg total) by mouth daily., Disp: 30 tablet, Rfl: 5  Observations/Objective: Patient is well-developed, well-nourished in no acute distress.  Resting comfortably at home.  Head is normocephalic, atraumatic.  No labored breathing.  Speech is clear and coherent with logical content.  Patient is alert and oriented at baseline.  Sounds congested.  Assessment and Plan: 1. Sinusitis, unspecified chronicity, unspecified location Augmentin OTC cough and cold meds  Follow Up Instructions: I discussed the assessment and treatment plan with the patient. The patient was provided an opportunity to ask questions and all were answered. The patient agreed with the plan and demonstrated an understanding of the instructions.  A copy of instructions were sent to the patient via MyChart unless otherwise noted below.     The patient was advised to call back or seek an in-person evaluation if the symptoms worsen or if the condition fails to improve as anticipated.  Time:  I spent 10 minutes with the patient via telehealth technology discussing the above problems/concerns.    Roxy Horseman, PA-C

## 2021-04-04 NOTE — Patient Instructions (Signed)
   We are sorry that you are not feeling well.  Here is how we plan to help!  Based on what you have shared with me it looks like you have sinusitis.  Sinusitis is inflammation and infection in the sinus cavities of the head.  Based on your presentation I believe you most likely have Acute Bacterial Sinusitis.  This is an infection caused by bacteria and is treated with antibiotics. I have prescribed Augmentin 875mg /125mg  one tablet twice daily with food, for 7 days. You may use an oral decongestant such as Mucinex D or if you have glaucoma or high blood pressure use plain Mucinex. Saline nasal spray help and can safely be used as often as needed for congestion.  If you develop worsening sinus pain, fever or notice severe headache and vision changes, or if symptoms are not better after completion of antibiotic, please schedule an appointment with a health care provider.    Sinus infections are not as easily transmitted as other respiratory infection, however we still recommend that you avoid close contact with loved ones, especially the very young and elderly.  Remember to wash your hands thoroughly throughout the day as this is the number one way to prevent the spread of infection!  Home Care: Only take medications as instructed by your medical team. Complete the entire course of an antibiotic. Do not take these medications with alcohol. A steam or ultrasonic humidifier can help congestion.  You can place a towel over your head and breathe in the steam from hot water coming from a faucet. Avoid close contacts especially the very young and the elderly. Cover your mouth when you cough or sneeze. Always remember to wash your hands.  Get Help Right Away If: You develop worsening fever or sinus pain. You develop a severe head ache or visual changes. Your symptoms persist after you have completed your treatment plan.  Make sure you Understand these instructions. Will watch your condition. Will get  help right away if you are not doing well or get worse.

## 2021-05-09 ENCOUNTER — Ambulatory Visit (INDEPENDENT_AMBULATORY_CARE_PROVIDER_SITE_OTHER): Payer: 59 | Admitting: Adult Health

## 2021-05-09 ENCOUNTER — Encounter: Payer: Self-pay | Admitting: Adult Health

## 2021-05-09 ENCOUNTER — Other Ambulatory Visit: Payer: Self-pay

## 2021-05-09 DIAGNOSIS — F422 Mixed obsessional thoughts and acts: Secondary | ICD-10-CM

## 2021-05-09 DIAGNOSIS — F909 Attention-deficit hyperactivity disorder, unspecified type: Secondary | ICD-10-CM | POA: Diagnosis not present

## 2021-05-09 DIAGNOSIS — F4310 Post-traumatic stress disorder, unspecified: Secondary | ICD-10-CM | POA: Diagnosis not present

## 2021-05-09 DIAGNOSIS — R69 Illness, unspecified: Secondary | ICD-10-CM | POA: Diagnosis not present

## 2021-05-09 DIAGNOSIS — F331 Major depressive disorder, recurrent, moderate: Secondary | ICD-10-CM

## 2021-05-09 DIAGNOSIS — F411 Generalized anxiety disorder: Secondary | ICD-10-CM

## 2021-05-09 MED ORDER — SERTRALINE HCL 100 MG PO TABS: 100.0000 mg | ORAL_TABLET | Freq: Every day | ORAL | 5 refills | Status: DC

## 2021-05-09 MED ORDER — METHYLPHENIDATE HCL 10 MG PO TABS: 10.0000 mg | ORAL_TABLET | Freq: Two times a day (BID) | ORAL | 0 refills | Status: DC

## 2021-05-09 MED ORDER — ALPRAZOLAM 0.5 MG PO TABS: 0.5000 mg | ORAL_TABLET | Freq: Two times a day (BID) | ORAL | 2 refills | Status: DC | PRN

## 2021-05-09 NOTE — Progress Notes (Signed)
Margaret Alvarado [id number] 04-Apr-1986 35 y.o.  Subjective:   Patient ID:  Margaret Alvarado is a 35 y.o. (DOB 03/14/1986) female.  Chief Complaint: No chief complaint on file.   HPI Sharna Gabrys presents to the office today for follow-up of GAD, MDD, PTSD, ADHD, obsessional thoughts.  Describes mood today as "not to good". Pleasant. Tearful. Mood symptoms - reports increased anxiety, irritability and depression. Reports increased worry and rumination. Stating "I'm not doing good at all". Son attempted suicide over the weekend and is currently hospitalized at behavioral health. Feels like medications are helpful overall - but is "struggling" right now. Has started a new job and work schedule. Would like to switch from Concerta to Ritalin - needing a shorter span of time coverage. Decreased interest and motivation. Taking medications as prescribed.  Energy levels improved. Active, does not have a regular exercise routine.   Enjoys some usual interests and activities. Married. Lives with her husband and 3 children - teenagers. Family local. Spending time with family. Appetite adequate.  Weight stable - 150 pounds. Sleeps well most nights. Averages 6 to 8 hours.  Focus and concentration improved. Completing tasks. Managing aspects of household. Works at [employer] to [employer] - recently started.   Denies SI or HI.  Denies AH or VH.       AUDIT    Flowsheet Row Office Visit from 10/12/2020 in [facility]  Alcohol Use Disorder Identification Test Final Score (AUDIT) 3      GAD-7    Flowsheet Row Office Visit from 10/12/2020 in [facility]  Total GAD-7 Score 15      PHQ2-9    Flowsheet Row Office Visit from 10/12/2020 in [facility]  PHQ-2 Total Score 0  PHQ-9 Total Score 3      Flowsheet Row ED from 10/05/2020 in [facility] at  [facility] RISK CATEGORY No Risk        Review of Systems:  Review of Systems  Musculoskeletal:  Negative for gait problem.  Neurological:  Negative for tremors.  Psychiatric/Behavioral:         Please refer to HPI   Medications: I have reviewed the patient's current medications.  Current Outpatient Medications  Medication Sig Dispense Refill   methylphenidate (RITALIN) 10 MG tablet Take 1 tablet (10 mg total) by mouth 2 (two) times daily. 30 tablet 0   ALPRAZolam (XANAX) 0.5 MG tablet Take 1 tablet (0.5 mg total) by mouth 2 (two) times daily as needed for anxiety. 60 tablet 2   amoxicillin-clavulanate (AUGMENTIN) 875-125 MG tablet Take 1 tablet by mouth every 12 (twelve) hours. 14 tablet 0   benzonatate (TESSALON) 100 MG capsule Take 1 capsule (100 mg total) by mouth 3 (three) times daily as needed for cough. 30 capsule 0   buPROPion (WELLBUTRIN XL) 150 MG 24 hr tablet Take 150 mg by mouth every morning.     fluticasone (FLONASE) 50 MCG/ACT nasal spray Place 2 sprays into both nostrils daily. 16 g 6   methylphenidate (CONCERTA) 27 MG PO CR tablet Take 1 tablet (27 mg total) by mouth every morning. 30 tablet 0   methylphenidate (CONCERTA) 27 MG PO CR tablet Take 1 tablet (27 mg total) by mouth every morning. 30 tablet 0   methylphenidate (CONCERTA) 27 MG PO CR tablet Take 1 tablet (27 mg total) by mouth daily. 30 tablet 0   sertraline (ZOLOFT) 100  MG tablet Take 1 tablet (100 mg total) by mouth daily. 30 tablet 5   No current facility-administered medications for this visit.    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   Please see review of systems for further  details on the patient's review from today.   Objective:   Physical Exam:  There were no vitals taken for this visit.  Physical Exam Constitutional:      General: She is not in acute distress. Musculoskeletal:        General: No deformity.  Neurological:     Mental Status: She is alert and oriented to person, place, and time.     Coordination: Coordination normal.  Psychiatric:        Attention and Perception: Attention and perception normal. She does not perceive auditory or visual hallucinations.        Mood and Affect: Mood normal. Mood is not anxious or depressed. Affect is not labile, blunt, angry or inappropriate.        Speech: Speech normal.        Behavior: Behavior normal.        Thought Content: Thought content normal. Thought content is not paranoid or delusional. Thought content does not include homicidal or suicidal ideation. Thought content does not include homicidal or suicidal plan.        Cognition and Memory: Cognition and memory normal.        Judgment: Judgment normal.     Comments: Insight intact    Lab Review:     Component Value Date/Time   NA 138 10/12/2020 1057   K 4.4 10/12/2020 1057   CL 105 10/12/2020 1057   CO2 26 10/12/2020 1057   GLUCOSE 88 10/12/2020 1057   BUN 15 10/12/2020 1057   CREATININE 0.67 10/12/2020 1057   CALCIUM 8.9 10/12/2020 1057   PROT 6.8 10/12/2020 1057   ALBUMIN 4.1 10/12/2020 1057   AST 21 10/12/2020 1057   ALT 26 10/12/2020 1057   ALKPHOS 59 10/12/2020 1057   BILITOT 0.4 10/12/2020 1057   GFRNONAA >60 10/12/2020 1057       Component Value Date/Time   WBC 8.7 10/12/2020 1057   RBC 4.37 10/12/2020 1057   HGB 13.7 10/12/2020 1057   HCT 41.5 10/12/2020 1057   PLT 229 10/12/2020 1057   MCV 95.0 10/12/2020 1057   MCH 31.4 10/12/2020 1057   MCHC 33.0 10/12/2020 1057   RDW 11.7 10/12/2020 1057   LYMPHSABS 2.1 10/12/2020 1057   MONOABS 0.6 10/12/2020 1057   EOSABS 0.3 10/12/2020 1057   BASOSABS 0.0 10/12/2020 1057     No results found for: POCLITH, LITHIUM   No results found for: PHENYTOIN, PHENOBARB, VALPROATE, CBMZ   .res Assessment: Plan:    Plan:  PDMP reviewed  1. Wellbutrin XL 150mg  every morning - OB-GYN 2. Xanax 0.5mg  BID 3. Zoloft 100mg  daily  4. Concerta 27mg  daily  125/91 74  Read and reviewed note with patient for accuracy.   RTC 3 months  Patient advised to contact office with any questions, adverse effects, or acute worsening in signs and symptoms.  Discussed potential benefits, risk, and side effects of benzodiazepines to include  potential risk of tolerance and dependence, as well as possible drowsiness.  Advised patient not to drive if experiencing drowsiness and to take lowest possible effective dose to minimize risk of dependence and tolerance.  Diagnoses and all orders for this visit:  PTSD (post-traumatic stress disorder)  Attention deficit hyperactivity disorder (ADHD), unspecified ADHD type -     methylphenidate (RITALIN) 10 MG tablet; Take 1 tablet (10 mg total) by mouth 2 (two) times daily.  Major depressive disorder, recurrent episode, moderate (HCC) -     sertraline (ZOLOFT) 100 MG tablet; Take 1 tablet (100 mg total) by mouth daily.  Generalized anxiety disorder -     ALPRAZolam (XANAX) 0.5 MG tablet; Take 1 tablet (0.5 mg total) by mouth 2 (two) times daily as needed for anxiety.  Mixed obsessional thoughts and acts    Please see After Visit Summary for patient specific instructions.  Future Appointments  Date Time Provider Department Center  06/06/2021  8:20 AM Amina Menchaca, Thereasa Solo, NP [facility] None    No orders of the defined types were placed in this encounter.   -------------------------------

## 2021-05-12 ENCOUNTER — Telehealth: Payer: Self-pay | Admitting: Adult Health

## 2021-05-12 NOTE — Telephone Encounter (Signed)
Pt called the quanity on Rx for Ritalin 10 mg 2/d should read #60 .Sent 11/7.  Please correct and send to pharmacy.

## 2021-05-12 NOTE — Telephone Encounter (Signed)
Called Margaret Alvarado and the pharmacy is closed until 2:30.

## 2021-05-16 ENCOUNTER — Telehealth: Payer: Self-pay | Admitting: Adult Health

## 2021-05-16 NOTE — Telephone Encounter (Signed)
Please review

## 2021-05-16 NOTE — Telephone Encounter (Signed)
I don't see where this was changed to #60? Do we need to follow up?

## 2021-05-16 NOTE — Telephone Encounter (Signed)
She will need an appt for extended time away from work.

## 2021-05-16 NOTE — Telephone Encounter (Signed)
Pt called and said that she was in last Monday and that gina wrote her out of work for two weeks. She would like to extend that leave another 2 weeks. Please write another letter for another two weeks and mail the letter to her. Her phone neumber is [phone]

## 2021-05-17 ENCOUNTER — Other Ambulatory Visit: Payer: Self-pay

## 2021-05-17 DIAGNOSIS — F909 Attention-deficit hyperactivity disorder, unspecified type: Secondary | ICD-10-CM

## 2021-05-17 MED ORDER — METHYLPHENIDATE HCL 10 MG PO TABS: 10.0000 mg | ORAL_TABLET | Freq: Two times a day (BID) | ORAL | 0 refills | Status: DC

## 2021-05-17 NOTE — Telephone Encounter (Signed)
Pt informed please contact her for a sooner appt

## 2021-05-17 NOTE — Telephone Encounter (Signed)
Since patient had already picked up #30 had to send new Rx and that could not be picked up until time for new refill. A new Rx for #60 was sent to [facility] and patient was notified.

## 2021-05-20 ENCOUNTER — Other Ambulatory Visit: Payer: Self-pay

## 2021-05-20 ENCOUNTER — Encounter: Payer: Self-pay | Admitting: Adult Health

## 2021-05-20 ENCOUNTER — Ambulatory Visit (INDEPENDENT_AMBULATORY_CARE_PROVIDER_SITE_OTHER): Payer: 59 | Admitting: Adult Health

## 2021-05-20 DIAGNOSIS — F331 Major depressive disorder, recurrent, moderate: Secondary | ICD-10-CM | POA: Diagnosis not present

## 2021-05-20 DIAGNOSIS — R69 Illness, unspecified: Secondary | ICD-10-CM | POA: Diagnosis not present

## 2021-05-20 DIAGNOSIS — F422 Mixed obsessional thoughts and acts: Secondary | ICD-10-CM | POA: Diagnosis not present

## 2021-05-20 DIAGNOSIS — F4310 Post-traumatic stress disorder, unspecified: Secondary | ICD-10-CM | POA: Diagnosis not present

## 2021-05-20 DIAGNOSIS — F411 Generalized anxiety disorder: Secondary | ICD-10-CM

## 2021-05-20 DIAGNOSIS — F909 Attention-deficit hyperactivity disorder, unspecified type: Secondary | ICD-10-CM

## 2021-05-20 NOTE — Progress Notes (Signed)
Margaret Alvarado [id number] Sep 21, 1985 36 y.o.  Subjective:   Patient ID:  Margaret Alvarado is a 35 y.o. (DOB 21-Sep-1985) female.  Chief Complaint: No chief complaint on file.   HPI Margaret Alvarado presents to the office today for follow-up of GAD, MDD, PTSD, ADHD, obsessional thoughts.  Describes mood today as "about the same". Pleasant. Tearful. Mood symptoms - reports anxiety, irritability and depression. Reports increased worry and rumination. Stating "I'm in a fog". Son recently released from hospital after an attempted suicide. Son is improving. Participating in an IOP program. Does not feel comfortable leaving him alone yet. Stating "I need to be with him right now".  Does not feel ready to return to work at this time Stable interest and motivation. Taking medications as prescribed. Energy levels lower - "in a brain fog". Active, does not have a regular exercise routine.   Enjoys some usual interests and activities. Married. Lives with her husband and 3 children - teenagers. Family local. Spending time with family. Appetite adequate. Weight stable - 150 pounds. Sleeping difficulties - up and down during the night checking on her son. Averages a few hours.  Focus and concentration improved. Completing tasks. Managing aspects of household. Works at [employer] to [employer] - out on leave. Denies SI or HI.  Denies AH or VH.   AUDIT    Flowsheet Row Office Visit from 10/12/2020 in [facility]  Alcohol Use Disorder Identification Test Final Score (AUDIT) 3      GAD-7    Flowsheet Row Office Visit from 10/12/2020 in [facility]  Total GAD-7 Score 15      PHQ2-9    Flowsheet Row Office Visit from 10/12/2020 in [facility]  PHQ-2 Total Score 0  PHQ-9 Total Score 3      Flowsheet Row ED from 10/05/2020 in [facility] at [facility] RISK CATEGORY No Risk        Review of Systems:  Review of Systems  Musculoskeletal:  Negative for gait problem.  Neurological:  Negative for tremors.  Psychiatric/Behavioral:         Please refer to HPI   Medications: I have reviewed the patient's current medications.  Current Outpatient Medications  Medication Sig Dispense Refill   ALPRAZolam (XANAX) 0.5 MG tablet Take 1 tablet (0.5 mg total) by mouth 2 (two) times daily as needed for anxiety. 60 tablet 2   amoxicillin-clavulanate (AUGMENTIN) 875-125 MG tablet Take 1 tablet by mouth every 12 (twelve) hours. 14 tablet 0   benzonatate (TESSALON) 100 MG capsule Take 1 capsule (100 mg total) by mouth 3 (three) times daily as needed for cough. 30 capsule 0   buPROPion (WELLBUTRIN XL) 150 MG 24 hr tablet Take 150 mg by mouth every morning.     fluticasone (FLONASE) 50 MCG/ACT nasal spray Place 2 sprays into both nostrils daily. 16 g 6   methylphenidate (CONCERTA) 27 MG PO CR tablet Take 1 tablet (27 mg total) by mouth every morning. 30 tablet 0   methylphenidate (CONCERTA) 27 MG PO CR tablet Take 1 tablet (27 mg total) by mouth every morning. 30 tablet 0   methylphenidate (CONCERTA) 27 MG PO CR tablet Take 1 tablet (27 mg total) by mouth daily. 30 tablet 0   [START ON 05/22/2021] methylphenidate (RITALIN) 10 MG tablet Take 1 tablet (10 mg total) by mouth 2 (two) times daily. 60 tablet 0  sertraline (ZOLOFT) 100 MG tablet Take 1 tablet (100 mg total) by mouth daily. 30 tablet 5   No current facility-administered medications for this visit.    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   Please see review of systems for  further details on the patient's review from today.   Objective:   Physical Exam:  There were no vitals taken for this visit.  Physical Exam Constitutional:      General: She is not in acute distress. Musculoskeletal:        General: No deformity.  Neurological:     Mental Status: She is alert and oriented to person, place, and time.     Coordination: Coordination normal.  Psychiatric:        Attention and Perception: Attention and perception normal. She does not perceive auditory or visual hallucinations.        Mood and Affect: Mood normal. Mood is not anxious or depressed. Affect is not labile, blunt, angry or inappropriate.        Speech: Speech normal.        Behavior: Behavior normal.        Thought Content: Thought content normal. Thought content is not paranoid or delusional. Thought content does not include homicidal or suicidal ideation. Thought content does not include homicidal or suicidal plan.        Cognition and Memory: Cognition and memory normal.        Judgment: Judgment normal.     Comments: Insight intact    Lab Review:     Component Value Date/Time   NA 138 10/12/2020 1057   K 4.4 10/12/2020 1057   CL 105 10/12/2020 1057   CO2 26 10/12/2020 1057   GLUCOSE 88 10/12/2020 1057   BUN 15 10/12/2020 1057   CREATININE 0.67 10/12/2020 1057   CALCIUM 8.9 10/12/2020 1057   PROT 6.8 10/12/2020 1057   ALBUMIN 4.1 10/12/2020 1057   AST 21 10/12/2020 1057   ALT 26 10/12/2020 1057   ALKPHOS 59 10/12/2020 1057   BILITOT 0.4 10/12/2020 1057   GFRNONAA >60 10/12/2020 1057       Component Value Date/Time   WBC 8.7 10/12/2020 1057   RBC 4.37 10/12/2020 1057   HGB 13.7 10/12/2020 1057   HCT 41.5 10/12/2020 1057   PLT 229 10/12/2020 1057   MCV 95.0 10/12/2020 1057   MCH 31.4 10/12/2020 1057   MCHC 33.0 10/12/2020 1057   RDW 11.7 10/12/2020 1057   LYMPHSABS 2.1 10/12/2020 1057   MONOABS 0.6 10/12/2020 1057   EOSABS 0.3 10/12/2020 1057   BASOSABS 0.0  10/12/2020 1057    No results found for: POCLITH, LITHIUM   No results found for: PHENYTOIN, PHENOBARB, VALPROATE, CBMZ   .res Assessment: Plan:    Plan:  PDMP reviewed  1. Wellbutrin XL 150mg  every morning - OB-GYN 2. Xanax 0.5mg  BID 3. Zoloft 100mg  daily  4. Concerta 27mg  daily  125/91 74  Read and reviewed note with patient for accuracy.   RTC 3 months  Patient advised to contact office with any questions, adverse effects, or acute worsening in signs and symptoms.  Discussed potential benefits, risk, and side effects of  benzodiazepines to include potential risk of tolerance and dependence, as well as possible drowsiness.  Advised patient not to drive if experiencing drowsiness and to take lowest possible effective dose to minimize risk of dependence and tolerance.   Diagnoses and all orders for this visit:  Attention deficit hyperactivity disorder (ADHD), unspecified ADHD type  Major depressive disorder, recurrent episode, moderate (HCC)  Generalized anxiety disorder  Mixed obsessional thoughts and acts  PTSD (post-traumatic stress disorder)    Please see After Visit Summary for patient specific instructions.  Future Appointments  Date Time Provider Department Center  06/06/2021  8:20 AM Jantzen Pilger, Thereasa Solo, NP CP-[facility] None    No orders of the defined types were placed in this encounter.   -------------------------------

## 2021-05-23 ENCOUNTER — Telehealth: Payer: Self-pay | Admitting: Adult Health

## 2021-05-23 NOTE — Telephone Encounter (Signed)
Taygan came by to drop off an Transport planner form for [employer]. Placed on Traci's desk. Patient is requesting to pick up when completed. Her phone number is [phone].

## 2021-05-31 DIAGNOSIS — Z0289 Encounter for other administrative examinations: Secondary | ICD-10-CM

## 2021-06-02 ENCOUNTER — Other Ambulatory Visit: Payer: Self-pay | Admitting: Adult Health

## 2021-06-02 DIAGNOSIS — F411 Generalized anxiety disorder: Secondary | ICD-10-CM

## 2021-06-02 NOTE — Telephone Encounter (Signed)
Pt came by to pick up completed FMLA paperwork.

## 2021-06-06 ENCOUNTER — Ambulatory Visit: Payer: 59 | Admitting: Adult Health

## 2021-06-06 NOTE — Progress Notes (Signed)
Patient no show appointment. ? ?

## 2021-06-16 ENCOUNTER — Encounter: Payer: Self-pay | Admitting: Adult Health

## 2021-06-16 ENCOUNTER — Other Ambulatory Visit: Payer: Self-pay

## 2021-06-16 ENCOUNTER — Ambulatory Visit (INDEPENDENT_AMBULATORY_CARE_PROVIDER_SITE_OTHER): Payer: 59 | Admitting: Adult Health

## 2021-06-16 DIAGNOSIS — F909 Attention-deficit hyperactivity disorder, unspecified type: Secondary | ICD-10-CM

## 2021-06-16 DIAGNOSIS — F411 Generalized anxiety disorder: Secondary | ICD-10-CM | POA: Diagnosis not present

## 2021-06-16 DIAGNOSIS — F4310 Post-traumatic stress disorder, unspecified: Secondary | ICD-10-CM | POA: Diagnosis not present

## 2021-06-16 DIAGNOSIS — F422 Mixed obsessional thoughts and acts: Secondary | ICD-10-CM | POA: Diagnosis not present

## 2021-06-16 DIAGNOSIS — F331 Major depressive disorder, recurrent, moderate: Secondary | ICD-10-CM | POA: Diagnosis not present

## 2021-06-16 DIAGNOSIS — R69 Illness, unspecified: Secondary | ICD-10-CM | POA: Diagnosis not present

## 2021-06-16 MED ORDER — BUPROPION HCL ER (XL) 150 MG PO TB24: 150.0000 mg | ORAL_TABLET | Freq: Every morning | ORAL | 2 refills | Status: DC

## 2021-06-16 MED ORDER — METHYLPHENIDATE HCL 20 MG PO TABS
10.0000 mg | ORAL_TABLET | Freq: Two times a day (BID) | ORAL | 0 refills | Status: DC
Start: 2021-06-16 — End: 2021-07-21

## 2021-06-16 NOTE — Progress Notes (Signed)
Margaret Alvarado [id number] [birth year] 35 y.o.  Subjective:   Patient ID:  Margaret Alvarado is a 35 y.o. (DOB 02-05-1986) female.  Chief Complaint: No chief complaint on file.   HPI Kimra Kantor presents to the office today for follow-up of GAD, MDD, PTSD, ADHD, obsessional thoughts.  Describes mood today as "somewhat better". Pleasant. Tearful. Mood symptoms - reports anxiety, irritability and depression. More anxious overall. Reports worry and rumination. Stating "I feel like I'm doing a little bit better".  Concerned about son - he is involved in intensive therapy and family therapy. He is also seeing a psychiatrist for medication management. His mood has been up and down - "a roller coaster ride". Stating "I think he is doing some better, but not where he needs to be". Stating "right now we all are in therapy individually and in family counseling. Foes not feel like she can return to work until son is finished with his treatments - having to take and attend sessions with son. Energy levels lower. Active, does not have a regular exercise routine.   Enjoys some usual interests and activities. Married. Lives with her husband and 3 children - teenagers. Family local. Spending time with family. Appetite adequate. Weight stable - 150 pounds. Sleep has started to improve. Averages 6 to 7 hours.  Focus and concentration "getting better". Completing tasks. Managing aspects of household. Works at [employer] to 8 - out on leave currently. Denies SI or HI.  Denies AH or VH.    AUDIT    Flowsheet Row Office Visit from 10/12/2020 in [facility]  Alcohol Use Disorder Identification Test Final Score (AUDIT) 3      GAD-7    Flowsheet Row Office Visit from 10/12/2020 in [facility]  Total GAD-7 Score 15      PHQ2-9    Flowsheet Row Office Visit from 10/12/2020 in [facility]  PHQ-2 Total Score 0  PHQ-9 Total Score 3      Flowsheet Row ED from 10/05/2020 in [facility] RISK CATEGORY No Risk        Review of Systems:  Review of Systems  Musculoskeletal:  Negative for gait problem.  Neurological:  Negative for tremors.  Psychiatric/Behavioral:         Please refer to HPI   Medications: I have reviewed the patient's current medications.  Current Outpatient Medications  Medication Sig Dispense Refill   ALPRAZolam (XANAX) 0.5 MG tablet Take 1 tablet (0.5 mg total) by mouth 2 (two) times daily as needed for anxiety. 60 tablet 2   amoxicillin-clavulanate (AUGMENTIN) 875-125 MG tablet Take 1 tablet by mouth every 12 (twelve) hours. 14 tablet 0   benzonatate (TESSALON) 100 MG capsule Take 1 capsule (100 mg total) by mouth 3 (three) times daily as needed for cough. 30 capsule 0   buPROPion (WELLBUTRIN XL) 150 MG 24 hr tablet Take 150 mg by mouth every morning.     fluticasone (FLONASE) 50 MCG/ACT nasal spray Place 2 sprays into both nostrils daily. 16 g 6   methylphenidate (CONCERTA) 27 MG PO CR tablet Take 1 tablet (27 mg total) by mouth every morning. 30 tablet 0   methylphenidate (CONCERTA) 27 MG PO CR tablet Take 1 tablet (27 mg total) by mouth every morning. 30 tablet 0   methylphenidate (CONCERTA) 27 MG PO CR tablet Take 1 tablet (27 mg total) by mouth  daily. 30 tablet 0   methylphenidate (RITALIN) 10 MG tablet Take 1 tablet (10 mg total) by mouth 2 (two) times daily. 60 tablet 0   sertraline (ZOLOFT) 100 MG tablet Take 1 tablet (100 mg total) by mouth daily. 30 tablet 5   No current facility-administered medications for this visit.    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   Please see review of systems for further details on the patient's review from today.   Objective:   Physical Exam:  There were no vitals taken for this visit.  Physical Exam Constitutional:      General: She is not in acute distress. Musculoskeletal:        General: No deformity.  Neurological:     Mental Status: She is alert and oriented to person, place, and time.     Coordination: Coordination normal.  Psychiatric:        Attention and Perception: Attention and perception normal. She does not perceive auditory or visual hallucinations.        Mood and Affect: Mood normal. Mood is not anxious or depressed. Affect is not labile, blunt, angry or inappropriate.        Speech: Speech normal.        Behavior: Behavior normal.        Thought Content: Thought content normal. Thought content is not paranoid or delusional. Thought content does not include homicidal or suicidal ideation. Thought content does not include homicidal or suicidal plan.        Cognition and Memory: Cognition and memory normal.        Judgment: Judgment normal.     Comments: Insight intact    Lab Review:     Component Value Date/Time   NA 138 10/12/2020 1057   K 4.4 10/12/2020 1057   CL 105 10/12/2020 1057   CO2 26 10/12/2020 1057   GLUCOSE 88 10/12/2020 1057   BUN 15 10/12/2020 1057   CREATININE 0.67 10/12/2020 1057   CALCIUM 8.9 10/12/2020 1057   PROT 6.8 10/12/2020 1057   ALBUMIN 4.1 10/12/2020 1057   AST 21 10/12/2020 1057   ALT 26 10/12/2020 1057   ALKPHOS 59 10/12/2020 1057   BILITOT 0.4 10/12/2020 1057   GFRNONAA >60 10/12/2020 1057       Component Value Date/Time   WBC 8.7 10/12/2020 1057   RBC 4.37 10/12/2020 1057   HGB 13.7 10/12/2020 1057   HCT 41.5 10/12/2020 1057   PLT 229 10/12/2020 1057   MCV 95.0 10/12/2020 1057   MCH 31.4 10/12/2020 1057   MCHC 33.0 10/12/2020 1057    RDW 11.7 10/12/2020 1057   LYMPHSABS 2.1 10/12/2020 1057   MONOABS 0.6 10/12/2020 1057   EOSABS 0.3 10/12/2020 1057   BASOSABS 0.0 10/12/2020 1057    No results found for: POCLITH, LITHIUM   No results found for: PHENYTOIN, PHENOBARB, VALPROATE, CBMZ   .res Assessment: Plan:   Plan:  PDMP reviewed  1. Wellbutrin XL 150mg  every morning 2. Xanax 0.5mg  BID 3. Zoloft 100mg  daily  4. Ritalin 10mg  BID to 20mg  BID  125/91 74  Patient to remain out of work 06/16/2021 - 07/17/2021. Will plan to  return to work mid January when son's treatment is completed.  RTC 3 months  Patient advised to contact office with any questions, adverse effects, or acute worsening in signs and symptoms.  Discussed potential benefits, risk, and side effects of benzodiazepines to include potential risk of tolerance and dependence, as well as possible drowsiness.  Advised patient not to drive if experiencing drowsiness and to take lowest possible effective dose to minimize risk of dependence and tolerance.  There are no diagnoses linked to this encounter.   Please see After Visit Summary for patient specific instructions.  Future Appointments  Date Time Provider Department Center  06/16/2021  1:40 PM Kim Lauver, Thereasa Solo, NP [facility] None    No orders of the defined types were placed in this encounter.   -------------------------------

## 2021-06-17 ENCOUNTER — Telehealth: Payer: Self-pay | Admitting: Adult Health

## 2021-06-17 NOTE — Telephone Encounter (Signed)
Pt called reporting she will need a doctor note stating LOA is extended to 07/05/21. Call Pt to pick up  [phone]

## 2021-06-17 NOTE — Telephone Encounter (Signed)
Letter at front desk

## 2021-06-17 NOTE — Telephone Encounter (Signed)
Pt stated after speaking to her job this is the day she must return to work.

## 2021-06-17 NOTE — Telephone Encounter (Signed)
See [place] Dec - letter completed.

## 2021-06-17 NOTE — Telephone Encounter (Signed)
That is not the dates listed from recent appt - call to see what has changed.

## 2021-06-17 NOTE — Telephone Encounter (Signed)
Pt informed

## 2021-06-17 NOTE — Telephone Encounter (Signed)
Please review

## 2021-06-17 NOTE — Telephone Encounter (Signed)
She stated she needs the letter by Monday please

## 2021-06-17 NOTE — Telephone Encounter (Signed)
ok 

## 2021-06-20 ENCOUNTER — Telehealth: Payer: Self-pay | Admitting: Adult Health

## 2021-06-20 NOTE — Telephone Encounter (Signed)
Pt brought in paperwork to complete with extended leave dates.  Placed in Traci's box. Fax to [phone] with cover sheet when completed.

## 2021-06-22 ENCOUNTER — Telehealth: Payer: Self-pay | Admitting: Adult Health

## 2021-06-22 NOTE — Telephone Encounter (Signed)
Received Mental Health Physican's Statement Form. Placed in Traci's box 12/21

## 2021-06-22 NOTE — Telephone Encounter (Signed)
Noted  

## 2021-06-22 NOTE — Telephone Encounter (Signed)
Received STD form from [employer]. Placed in Traci's box 12/21

## 2021-06-30 ENCOUNTER — Telehealth: Payer: Self-pay | Admitting: Adult Health

## 2021-06-30 DIAGNOSIS — Z0289 Encounter for other administrative examinations: Secondary | ICD-10-CM

## 2021-06-30 NOTE — Telephone Encounter (Signed)
Received fax from [employer] regarding Margaret Alvarado. Completion needed for an Attending Physicians Statement. Placed on Traci's desk.

## 2021-06-30 NOTE — Telephone Encounter (Signed)
2 sets of forms completed today and placed in Gina's box for signature

## 2021-07-06 ENCOUNTER — Ambulatory Visit: Payer: 59 | Admitting: Adult Health

## 2021-07-06 ENCOUNTER — Telehealth: Payer: Self-pay

## 2021-07-06 NOTE — Telephone Encounter (Signed)
Questions answered and faxed today 07/06/2021

## 2021-07-06 NOTE — Telephone Encounter (Signed)
That is fine 

## 2021-07-06 NOTE — Telephone Encounter (Signed)
Pt is asking if it's okay she returns to work on 07/11/2021 instead of 07/17/2021, informed her that is not a problem as long as she's ready and doesn't need the extra week. She feels ready.   She will email over a form for Rollene Fare to sign and fax to her employer, [employer].

## 2021-07-06 NOTE — Progress Notes (Signed)
Patient no show appointment. ? ?

## 2021-07-07 NOTE — Telephone Encounter (Signed)
Pt's RTW form was completed and faxed on 07/06/2021. Pt aware.

## 2021-07-15 ENCOUNTER — Ambulatory Visit: Payer: 59 | Admitting: Adult Health

## 2021-07-21 ENCOUNTER — Telehealth: Payer: Self-pay | Admitting: Adult Health

## 2021-07-21 ENCOUNTER — Other Ambulatory Visit: Payer: Self-pay

## 2021-07-21 DIAGNOSIS — F909 Attention-deficit hyperactivity disorder, unspecified type: Secondary | ICD-10-CM

## 2021-07-21 MED ORDER — METHYLPHENIDATE HCL 20 MG PO TABS: 10.0000 mg | ORAL_TABLET | Freq: Two times a day (BID) | ORAL | 0 refills | Status: DC

## 2021-07-21 NOTE — Telephone Encounter (Signed)
Pended.

## 2021-07-21 NOTE — Telephone Encounter (Signed)
Patient called in for refill on Methylphenidate 20mg . She called pharmacy and was told she had to call [facility] to get prescription filled. Ph: [phone] Appt 1/27. Pharmacy 2/27 [address] [place]

## 2021-07-25 ENCOUNTER — Other Ambulatory Visit: Payer: Self-pay | Admitting: Adult Health

## 2021-07-25 ENCOUNTER — Telehealth: Payer: Self-pay | Admitting: Adult Health

## 2021-07-25 DIAGNOSIS — F411 Generalized anxiety disorder: Secondary | ICD-10-CM

## 2021-07-25 NOTE — Telephone Encounter (Signed)
Margaret Alvarado called and is having a very hard time with getting her RX for Ritalin filled at [facility] on [address]. Can her RX be cancelled at [facility] and called in to:  [id number] [name], [place] - [address]  Phone:  [phone]  Fax:  [phone]

## 2021-07-26 ENCOUNTER — Other Ambulatory Visit: Payer: Self-pay

## 2021-07-26 DIAGNOSIS — F909 Attention-deficit hyperactivity disorder, unspecified type: Secondary | ICD-10-CM

## 2021-07-26 MED ORDER — METHYLPHENIDATE HCL 20 MG PO TABS: 10.0000 mg | ORAL_TABLET | Freq: Two times a day (BID) | ORAL | 0 refills | Status: DC

## 2021-07-26 NOTE — Telephone Encounter (Signed)
Pended.

## 2021-07-26 NOTE — Telephone Encounter (Signed)
Filled 9/13 appt on 1/27

## 2021-07-29 ENCOUNTER — Ambulatory Visit: Payer: 59 | Admitting: Adult Health

## 2021-07-29 NOTE — Progress Notes (Signed)
Patient no show appointment. ? ?

## 2021-08-01 ENCOUNTER — Ambulatory Visit: Payer: 59 | Admitting: Adult Health

## 2021-08-01 NOTE — Progress Notes (Signed)
Patient no show appointment. ? ?

## 2021-08-16 ENCOUNTER — Encounter: Payer: Self-pay | Admitting: Adult Health

## 2021-08-16 ENCOUNTER — Ambulatory Visit (INDEPENDENT_AMBULATORY_CARE_PROVIDER_SITE_OTHER): Payer: 59 | Admitting: Adult Health

## 2021-08-16 ENCOUNTER — Other Ambulatory Visit: Payer: Self-pay

## 2021-08-16 DIAGNOSIS — F411 Generalized anxiety disorder: Secondary | ICD-10-CM

## 2021-08-16 DIAGNOSIS — R69 Illness, unspecified: Secondary | ICD-10-CM | POA: Diagnosis not present

## 2021-08-16 DIAGNOSIS — F331 Major depressive disorder, recurrent, moderate: Secondary | ICD-10-CM | POA: Diagnosis not present

## 2021-08-16 DIAGNOSIS — F422 Mixed obsessional thoughts and acts: Secondary | ICD-10-CM

## 2021-08-16 DIAGNOSIS — F909 Attention-deficit hyperactivity disorder, unspecified type: Secondary | ICD-10-CM | POA: Diagnosis not present

## 2021-08-16 DIAGNOSIS — F4310 Post-traumatic stress disorder, unspecified: Secondary | ICD-10-CM | POA: Diagnosis not present

## 2021-08-16 MED ORDER — BUPROPION HCL ER (XL) 150 MG PO TB24: 150.0000 mg | ORAL_TABLET | Freq: Every morning | ORAL | 5 refills | Status: DC

## 2021-08-16 MED ORDER — SERTRALINE HCL 100 MG PO TABS: 100.0000 mg | ORAL_TABLET | Freq: Every day | ORAL | 5 refills | Status: DC

## 2021-08-16 MED ORDER — ALPRAZOLAM 0.5 MG PO TABS: ORAL_TABLET | ORAL | 2 refills | Status: DC

## 2021-08-16 MED ORDER — METHYLPHENIDATE HCL 20 MG PO TABS: ORAL_TABLET | ORAL | 0 refills | Status: DC

## 2021-08-16 NOTE — Progress Notes (Signed)
Margaret Alvarado [id number] [birth year] 36 y.o.  Subjective:   Patient ID:  Margaret Alvarado is a 36 y.o. (DOB 1986-02-25) female.  Chief Complaint: No chief complaint on file.   HPI Jacquelin Krajewski presents to the office today for follow-up of GAD, MDD, PTSD, ADHD, obsessional thoughts.  Describes mood today as "ok". Pleasant. Decreased tearfulness. Mood symptoms - reports anxiety, irritability and depression. Reports decreased worry and rumination. Stating "I feel like I'm doing ok". Decreased situational stressors. Son doing better. Stable interest and motivation. Taking medications as prescribed.  Energy levels lower. Active, does not have a regular exercise routine.   Enjoys some usual interests and activities. Married. Lives with her husband and 3 children - teenagers. Family local. Spending time with family. Appetite adequate. Weight stable - 150 pounds. Sleeps well most nights. Averages 8 hours.  Focus and concentration stable. Completing tasks. Managing aspects of household. Works at [employer] to 8. Denies SI or HI.  Denies AH or VH.    AUDIT    Flowsheet Row Office Visit from 10/12/2020 in [facility]  Alcohol Use Disorder Identification Test Final Score (AUDIT) 3      GAD-7    Flowsheet Row Office Visit from 10/12/2020 in [facility]  Total GAD-7 Score 15      PHQ2-9    Flowsheet Row Office Visit from 10/12/2020 in [facility]  PHQ-2 Total Score 0  PHQ-9 Total Score 3      Flowsheet Row ED from 10/05/2020 in [facility] RISK CATEGORY No Risk        Review of Systems:  Review of Systems  Musculoskeletal:  Negative for gait problem.  Neurological:  Negative for tremors.  Psychiatric/Behavioral:         Please refer to HPI   Medications: I have reviewed the patient's current  medications.  Current Outpatient Medications  Medication Sig Dispense Refill   ALPRAZolam (XANAX) 0.5 MG tablet TAKE 1 TABLET BY MOUTH TWICE A DAY AS NEEDED FOR ANXIETY 60 tablet 2   amoxicillin-clavulanate (AUGMENTIN) 875-125 MG tablet Take 1 tablet by mouth every 12 (twelve) hours. 14 tablet 0   benzonatate (TESSALON) 100 MG capsule Take 1 capsule (100 mg total) by mouth 3 (three) times daily as needed for cough. 30 capsule 0   buPROPion (WELLBUTRIN XL) 150 MG 24 hr tablet Take 1 tablet (150 mg total) by mouth every morning. 30 tablet 5   fluticasone (FLONASE) 50 MCG/ACT nasal spray Place 2 sprays into both nostrils daily. 16 g 6   methylphenidate (RITALIN) 20 MG tablet Take one tablet twice daily. 60 tablet 0   sertraline (ZOLOFT) 100 MG tablet Take 1 tablet (100 mg total) by mouth daily. 30 tablet 5   No current facility-administered medications for this visit.    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   Please see review of systems for further details on the patient's review from today.   Objective:   Physical Exam:  There were no vitals taken for this visit.  Physical Exam Constitutional:  General: She is not in acute distress. Musculoskeletal:        General: No deformity.  Neurological:     Mental Status: She is alert and oriented to person, place, and time.     Coordination: Coordination normal.  Psychiatric:        Attention and Perception: Attention and perception normal. She does not perceive auditory or visual hallucinations.        Mood and Affect: Mood normal. Mood is not anxious or depressed. Affect is not labile, blunt, angry or inappropriate.         Speech: Speech normal.        Behavior: Behavior normal.        Thought Content: Thought content normal. Thought content is not paranoid or delusional. Thought content does not include homicidal or suicidal ideation. Thought content does not include homicidal or suicidal plan.        Cognition and Memory: Cognition and memory normal.        Judgment: Judgment normal.     Comments: Insight intact    Lab Review:     Component Value Date/Time   NA 138 10/12/2020 1057   K 4.4 10/12/2020 1057   CL 105 10/12/2020 1057   CO2 26 10/12/2020 1057   GLUCOSE 88 10/12/2020 1057   BUN 15 10/12/2020 1057   CREATININE 0.67 10/12/2020 1057   CALCIUM 8.9 10/12/2020 1057   PROT 6.8 10/12/2020 1057   ALBUMIN 4.1 10/12/2020 1057   AST 21 10/12/2020 1057   ALT 26 10/12/2020 1057   ALKPHOS 59 10/12/2020 1057   BILITOT 0.4 10/12/2020 1057   GFRNONAA >60 10/12/2020 1057       Component Value Date/Time   WBC 8.7 10/12/2020 1057   RBC 4.37 10/12/2020 1057   HGB 13.7 10/12/2020 1057   HCT 41.5 10/12/2020 1057   PLT 229 10/12/2020 1057   MCV 95.0 10/12/2020 1057   MCH 31.4 10/12/2020 1057   MCHC 33.0 10/12/2020 1057   RDW 11.7 10/12/2020 1057   LYMPHSABS 2.1 10/12/2020 1057   MONOABS 0.6 10/12/2020 1057   EOSABS 0.3 10/12/2020 1057   BASOSABS 0.0 10/12/2020 1057    No results found for: POCLITH, LITHIUM   No results found for: PHENYTOIN, PHENOBARB, VALPROATE, CBMZ   .res Assessment: Plan:    Plan:  PDMP reviewed  1. Wellbutrin XL 150mg  every morning 2. Xanax 0.5mg  BID 3. Zoloft 100mg  daily  4. Ritalin 10mg  BID to 20mg  BID  BP WNL  RTC 3 months  Patient advised to contact office with any questions, adverse effects, or acute worsening in signs and symptoms.  Discussed potential benefits, risk, and side effects of benzodiazepines to include potential risk of tolerance and dependence, as well as possible drowsiness.  Advised patient not to drive if experiencing drowsiness and to  take lowest possible effective dose to minimize risk of dependence and tolerance. Diagnoses and all orders for this visit:  PTSD (post-traumatic stress disorder)  Generalized anxiety disorder -     ALPRAZolam (XANAX) 0.5 MG tablet; TAKE 1 TABLET BY MOUTH TWICE A DAY AS NEEDED FOR ANXIETY  Major depressive disorder, recurrent episode, moderate (HCC) -     buPROPion (WELLBUTRIN XL) 150 MG 24 hr tablet; Take 1 tablet (150 mg total) by mouth every morning. -     sertraline (ZOLOFT) 100 MG tablet; Take 1 tablet (100 mg total) by mouth daily.  Attention deficit hyperactivity disorder (ADHD), unspecified ADHD type -     methylphenidate (  RITALIN) 20 MG tablet; Take one tablet twice daily.  Mixed obsessional thoughts and acts     Please see After Visit Summary for patient specific instructions.  No future appointments.  No orders of the defined types were placed in this encounter.   -------------------------------

## 2021-08-26 ENCOUNTER — Encounter: Payer: Self-pay | Admitting: Adult Health

## 2021-08-26 ENCOUNTER — Ambulatory Visit (INDEPENDENT_AMBULATORY_CARE_PROVIDER_SITE_OTHER): Payer: 59 | Admitting: Adult Health

## 2021-08-26 ENCOUNTER — Ambulatory Visit: Payer: 59 | Admitting: Adult Health

## 2021-08-26 DIAGNOSIS — F411 Generalized anxiety disorder: Secondary | ICD-10-CM | POA: Diagnosis not present

## 2021-08-26 DIAGNOSIS — F331 Major depressive disorder, recurrent, moderate: Secondary | ICD-10-CM

## 2021-08-26 DIAGNOSIS — F4310 Post-traumatic stress disorder, unspecified: Secondary | ICD-10-CM | POA: Diagnosis not present

## 2021-08-26 DIAGNOSIS — R69 Illness, unspecified: Secondary | ICD-10-CM | POA: Diagnosis not present

## 2021-08-26 DIAGNOSIS — F909 Attention-deficit hyperactivity disorder, unspecified type: Secondary | ICD-10-CM | POA: Diagnosis not present

## 2021-08-26 NOTE — Progress Notes (Signed)
Rynn Markiewicz [id number] 07/23/1985 36 y.o.  Subjective:   Patient ID:  Margaret Alvarado is a 36 y.o. (DOB [birth year]) female.  Chief Complaint: No chief complaint on file.   HPI Margaret Alvarado presents to the office today for follow-up of GAD, MDD, PTSD, ADHD, obsessional thoughts.  Describes mood today as "not good". Pleasant. Tearful throughout interview. Mood symptoms - reports increased anxiety, irritability and depression. Reports increased worry and rumination. Stating "I'm not doing well". Increased situational stressors with sons ongoing mental health issues. Concerned about leaving him home alone. Is hoping to be able to work from home so she can be there to support her family. Participating in individual, family and marital counseling. Stable interest and motivation. Taking medications as prescribed.  Energy levels lower. Active, does not have a regular exercise routine.   Enjoys some usual interests and activities. Married. Lives with her husband and 3 children - teenagers 48 (s), 17(s) and 18 (d). Family local. Spending time with family. Appetite adequate. Weight stable - 150 pounds. Sleeping less - up and down with son during the night. Averages 2 to 4 hours.  Focus and concentration difficulties with loss of sleep. Completing tasks. Managing aspects of household. Works at [employer] to 8. Denies SI or HI.  Denies AH or VH.         Review of Systems:  Review of Systems  Musculoskeletal:  Negative for gait problem.  Neurological:  Negative for tremors.  Psychiatric/Behavioral:         Please refer to HPI   Medications: I have reviewed the patient's current medications.  Current Outpatient Medications  Medication Sig Dispense Refill   ALPRAZolam (XANAX) 0.5 MG tablet TAKE 1 TABLET BY MOUTH TWICE A DAY AS NEEDED FOR ANXIETY 60 tablet 2   amoxicillin-clavulanate (AUGMENTIN) 875-125 MG tablet Take 1 tablet by mouth every 12 (twelve) hours. 14 tablet 0    benzonatate (TESSALON) 100 MG capsule Take 1 capsule (100 mg total) by mouth 3 (three) times daily as needed for cough. 30 capsule 0   buPROPion (WELLBUTRIN XL) 150 MG 24 hr tablet Take 1 tablet (150 mg total) by mouth every morning. 30 tablet 5   fluticasone (FLONASE) 50 MCG/ACT nasal spray Place 2 sprays into both nostrils daily. 16 g 6   methylphenidate (RITALIN) 20 MG tablet Take one tablet twice daily. 60 tablet 0   sertraline (ZOLOFT) 100 MG tablet Take 1 tablet (100 mg total) by mouth daily. 30 tablet 5   No current facility-administered medications for this visit.    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   Please see review of systems for further details on the patient's review from today.   Objective:   Physical Exam:  There were no vitals taken for this visit.  Physical Exam Constitutional:      General: She is not in acute distress. Musculoskeletal:        General: No deformity.  Neurological:     Mental Status: She is alert and oriented to person, place, and time.     Coordination: Coordination normal.  Psychiatric:        Attention and Perception: Attention and perception normal. She  does not perceive auditory or visual hallucinations.        Mood and Affect: Mood normal. Mood is not anxious or depressed. Affect is not labile, blunt, angry or inappropriate.        Speech: Speech normal.        Behavior: Behavior normal.        Thought Content: Thought content normal. Thought content is not paranoid or delusional. Thought content does not include homicidal or suicidal ideation. Thought content does not include homicidal or suicidal plan.        Cognition and  Memory: Cognition and memory normal.        Judgment: Judgment normal.     Comments: Insight intact    Lab Review:     Component Value Date/Time   NA 138 10/12/2020 1057   K 4.4 10/12/2020 1057   CL 105 10/12/2020 1057   CO2 26 10/12/2020 1057   GLUCOSE 88 10/12/2020 1057   BUN 15 10/12/2020 1057   CREATININE 0.67 10/12/2020 1057   CALCIUM 8.9 10/12/2020 1057   PROT 6.8 10/12/2020 1057   ALBUMIN 4.1 10/12/2020 1057   AST 21 10/12/2020 1057   ALT 26 10/12/2020 1057   ALKPHOS 59 10/12/2020 1057   BILITOT 0.4 10/12/2020 1057   GFRNONAA >60 10/12/2020 1057       Component Value Date/Time   WBC 8.7 10/12/2020 1057   RBC 4.37 10/12/2020 1057   HGB 13.7 10/12/2020 1057   HCT 41.5 10/12/2020 1057   PLT 229 10/12/2020 1057   MCV 95.0 10/12/2020 1057   MCH 31.4 10/12/2020 1057   MCHC 33.0 10/12/2020 1057   RDW 11.7 10/12/2020 1057   LYMPHSABS 2.1 10/12/2020 1057   MONOABS 0.6 10/12/2020 1057   EOSABS 0.3 10/12/2020 1057   BASOSABS 0.0 10/12/2020 1057    No results found for: POCLITH, LITHIUM   No results found for: PHENYTOIN, PHENOBARB, VALPROATE, CBMZ   .res Assessment: Plan:    Plan:  PDMP reviewed  1. Wellbutrin XL 150mg  every morning 2. Xanax 0.5mg  BID 3. Increase Zoloft 100mg  daily  4. Ritalin 10mg  BID to 20mg  BID  BP WNL  RTC 3 months  Patient advised to contact office with any questions, adverse effects, or acute worsening in signs and symptoms.  Discussed potential benefits, risk, and side effects of benzodiazepines to include potential risk of tolerance and dependence, as well as possible drowsiness.  Advised patient not to drive if experiencing drowsiness and to take lowest possible effective dose to minimize risk of dependence and tolerance. Diagnoses and all orders for this visit:  Attention deficit hyperactivity disorder (ADHD), unspecified ADHD type  Generalized anxiety disorder  PTSD (post-traumatic stress disorder)  Major depressive  disorder, recurrent episode, moderate (HCC)     Please see After Visit Summary for patient specific instructions.  Future Appointments  Date Time Provider Department Center  11/15/2021  8:00 AM Piedad Standiford, , NP [facility] None    No orders of the defined types were placed in this encounter.   -------------------------------

## 2021-08-26 NOTE — Progress Notes (Signed)
Patient no show appointment. ? ?

## 2021-08-30 ENCOUNTER — Telehealth: Payer: Self-pay | Admitting: Adult Health

## 2021-08-30 NOTE — Telephone Encounter (Signed)
Margaret Alvarado dropped this form off to be completed. It's from [employer] needing an FMLA form completed. Placed in Traci's box.

## 2021-09-05 ENCOUNTER — Telehealth: Payer: 59 | Admitting: Physician Assistant

## 2021-09-05 DIAGNOSIS — J019 Acute sinusitis, unspecified: Secondary | ICD-10-CM | POA: Diagnosis not present

## 2021-09-05 DIAGNOSIS — B9689 Other specified bacterial agents as the cause of diseases classified elsewhere: Secondary | ICD-10-CM

## 2021-09-05 MED ORDER — AMOXICILLIN-POT CLAVULANATE 875-125 MG PO TABS: 1.0000 | ORAL_TABLET | Freq: Two times a day (BID) | ORAL | 0 refills | Status: DC

## 2021-09-05 NOTE — Progress Notes (Signed)

## 2021-09-20 ENCOUNTER — Telehealth: Payer: Self-pay | Admitting: Adult Health

## 2021-09-20 ENCOUNTER — Other Ambulatory Visit: Payer: Self-pay

## 2021-09-20 DIAGNOSIS — F909 Attention-deficit hyperactivity disorder, unspecified type: Secondary | ICD-10-CM

## 2021-09-20 MED ORDER — METHYLPHENIDATE HCL 20 MG PO TABS: ORAL_TABLET | ORAL | 0 refills | Status: DC

## 2021-09-20 NOTE — Telephone Encounter (Signed)
Next visit is 11/15/21. Margaret Alvarado called requesting a refill on her Methylphenidate called to: ? ?[id number] [name], [place] - [address] ? ?Phone:  [phone]  ?Fax:  [phone]  ? ? ? ? ? ?

## 2021-09-20 NOTE — Telephone Encounter (Signed)
Pended.

## 2021-10-05 ENCOUNTER — Telehealth: Payer: 59 | Admitting: Nurse Practitioner

## 2021-10-05 DIAGNOSIS — L03116 Cellulitis of left lower limb: Secondary | ICD-10-CM

## 2021-10-05 MED ORDER — CEPHALEXIN 500 MG PO CAPS: 500.0000 mg | ORAL_CAPSULE | Freq: Two times a day (BID) | ORAL | 0 refills | Status: DC

## 2021-10-05 NOTE — Progress Notes (Signed)

## 2021-10-24 ENCOUNTER — Other Ambulatory Visit: Payer: Self-pay | Admitting: Adult Health

## 2021-10-24 ENCOUNTER — Telehealth: Payer: Self-pay | Admitting: Adult Health

## 2021-10-24 DIAGNOSIS — F909 Attention-deficit hyperactivity disorder, unspecified type: Secondary | ICD-10-CM

## 2021-10-24 MED ORDER — METHYLPHENIDATE HCL 20 MG PO TABS: ORAL_TABLET | ORAL | 0 refills | Status: DC

## 2021-10-24 NOTE — Telephone Encounter (Signed)
Pt would like refill of Ritalin called in to  ? ?[id number] [name], [place] - [address]  ?[address], [place] [address]  ?Phone:  [phone]  Fax:  [phone]  ? ?Next appt 5/16 ?

## 2021-10-24 NOTE — Telephone Encounter (Signed)
Script sent  

## 2021-11-15 ENCOUNTER — Ambulatory Visit: Payer: 59 | Admitting: Adult Health

## 2021-11-21 ENCOUNTER — Telehealth: Payer: Self-pay | Admitting: Adult Health

## 2021-11-21 ENCOUNTER — Other Ambulatory Visit: Payer: Self-pay | Admitting: Adult Health

## 2021-11-21 ENCOUNTER — Other Ambulatory Visit (HOSPITAL_BASED_OUTPATIENT_CLINIC_OR_DEPARTMENT_OTHER): Payer: Self-pay

## 2021-11-21 DIAGNOSIS — F909 Attention-deficit hyperactivity disorder, unspecified type: Secondary | ICD-10-CM

## 2021-11-21 MED ORDER — METHYLPHENIDATE HCL 20 MG PO TABS
ORAL_TABLET | ORAL | 0 refills | Status: DC
  Filled 2021-11-21: qty 60, 30d supply, fill #0

## 2021-11-21 NOTE — Telephone Encounter (Signed)
Pt would like refill of generic Ritalin to [facility].  They have it in stock.  Next appt 6/16

## 2021-11-21 NOTE — Telephone Encounter (Signed)
Script sent  

## 2021-11-22 ENCOUNTER — Telehealth: Payer: 59 | Admitting: Nurse Practitioner

## 2021-11-22 DIAGNOSIS — L03116 Cellulitis of left lower limb: Secondary | ICD-10-CM | POA: Diagnosis not present

## 2021-11-23 ENCOUNTER — Telehealth: Payer: 59 | Admitting: Nurse Practitioner

## 2021-11-23 ENCOUNTER — Other Ambulatory Visit (HOSPITAL_BASED_OUTPATIENT_CLINIC_OR_DEPARTMENT_OTHER): Payer: Self-pay

## 2021-11-23 DIAGNOSIS — L0233 Carbuncle of buttock: Secondary | ICD-10-CM

## 2021-11-23 MED ORDER — CEPHALEXIN 500 MG PO CAPS
500.0000 mg | ORAL_CAPSULE | Freq: Four times a day (QID) | ORAL | 0 refills | Status: DC
  Filled 2021-11-23: qty 20, 5d supply, fill #0

## 2021-11-23 NOTE — Progress Notes (Signed)
Already responded earlier this morning and sent  in prescription for keflex.  E Visit for Cellulitis  We are sorry that you are not feeling well. Here is how we plan to help!  Based on what you shared with me it looks like you have cellulitis.  Cellulitis looks like areas of skin redness, swelling, and warmth; it develops as a result of bacteria entering under the skin. Little red spots and/or bleeding can be seen in skin, and tiny surface sacs containing fluid can occur. Fever can be present. Cellulitis is almost always on one side of a body, and the lower limbs are the most common site of involvement.   I have prescribed:  Keflex 500mg  take one by mouth four times a day for 5 days  HOME CARE:  Take your medications as ordered and take all of them, even if the skin irritation appears to be healing.   GET HELP RIGHT AWAY IF:  Symptoms that don't begin to go away within 48 hours. Severe redness persists or worsens If the area turns color, spreads or swells. If it blisters and opens, develops yellow-brown crust or bleeds. You develop a fever or chills. If the pain increases or becomes unbearable.  Are unable to keep fluids and food down.  MAKE SURE YOU   Understand these instructions. Will watch your condition. Will get help right away if you are not doing well or get worse.  Thank you for choosing an e-visit.  Your e-visit answers were reviewed by a board certified advanced clinical practitioner to complete your personal care plan. Depending upon the condition, your plan could have included both over the counter or prescription medications.  Please review your pharmacy choice. Make sure the pharmacy is open so you can pick up prescription now. If there is a problem, you may contact your provider through [employer] and have the prescription routed to another pharmacy.  Your safety is important to [place]. If you have drug allergies check your prescription carefully.   For the next  24 hours you can use MyChart to ask questions about today's visit, request a non-urgent call back, or ask for a work or school excuse. You will get an email in the next two days asking about your experience. I hope that your e-visit has been valuable and will speed your recovery.  5-10 minutes spent reviewing and documenting in chart.

## 2021-11-23 NOTE — Progress Notes (Signed)

## 2021-11-24 ENCOUNTER — Other Ambulatory Visit (HOSPITAL_BASED_OUTPATIENT_CLINIC_OR_DEPARTMENT_OTHER): Payer: Self-pay

## 2021-11-24 DIAGNOSIS — N920 Excessive and frequent menstruation with regular cycle: Secondary | ICD-10-CM | POA: Diagnosis not present

## 2021-11-24 DIAGNOSIS — Z6824 Body mass index (BMI) 24.0-24.9, adult: Secondary | ICD-10-CM | POA: Diagnosis not present

## 2021-11-24 DIAGNOSIS — L0291 Cutaneous abscess, unspecified: Secondary | ICD-10-CM | POA: Diagnosis not present

## 2021-11-24 DIAGNOSIS — L739 Follicular disorder, unspecified: Secondary | ICD-10-CM | POA: Diagnosis not present

## 2021-11-24 MED ORDER — SULFAMETHOXAZOLE-TRIMETHOPRIM 800-160 MG PO TABS
ORAL_TABLET | ORAL | 0 refills | Status: DC
  Filled 2021-11-24: qty 14, 7d supply, fill #0

## 2021-11-25 ENCOUNTER — Other Ambulatory Visit (HOSPITAL_BASED_OUTPATIENT_CLINIC_OR_DEPARTMENT_OTHER): Payer: Self-pay

## 2021-11-25 MED ORDER — BUPROPION HCL ER (XL) 150 MG PO TB24
ORAL_TABLET | ORAL | 5 refills | Status: DC
  Filled 2021-11-25: qty 30, 30d supply, fill #0
  Filled 2022-01-05: qty 30, 30d supply, fill #1

## 2021-11-25 MED ORDER — CEPHALEXIN 500 MG PO CAPS
ORAL_CAPSULE | ORAL | 0 refills | Status: DC
  Filled 2021-11-25 – 2022-01-05 (×2): qty 10, 5d supply, fill #0

## 2021-11-25 MED ORDER — SERTRALINE HCL 100 MG PO TABS
ORAL_TABLET | ORAL | 5 refills | Status: DC
  Filled 2021-11-25: qty 30, 30d supply, fill #0
  Filled 2022-01-05: qty 30, 30d supply, fill #1
  Filled 2022-02-04 – 2022-04-04 (×3): qty 30, 30d supply, fill #2
  Filled 2022-05-04: qty 30, 30d supply, fill #3
  Filled 2022-06-04: qty 30, 30d supply, fill #4
  Filled 2022-07-03: qty 30, 30d supply, fill #5

## 2021-11-27 ENCOUNTER — Other Ambulatory Visit: Payer: Self-pay | Admitting: Adult Health

## 2021-11-27 DIAGNOSIS — F411 Generalized anxiety disorder: Secondary | ICD-10-CM

## 2021-11-28 DIAGNOSIS — F1910 Other psychoactive substance abuse, uncomplicated: Secondary | ICD-10-CM | POA: Diagnosis not present

## 2021-11-28 DIAGNOSIS — R69 Illness, unspecified: Secondary | ICD-10-CM | POA: Diagnosis not present

## 2021-11-28 DIAGNOSIS — Z79899 Other long term (current) drug therapy: Secondary | ICD-10-CM | POA: Diagnosis not present

## 2021-11-28 DIAGNOSIS — Z3200 Encounter for pregnancy test, result unknown: Secondary | ICD-10-CM | POA: Diagnosis not present

## 2021-11-28 DIAGNOSIS — L0291 Cutaneous abscess, unspecified: Secondary | ICD-10-CM | POA: Diagnosis not present

## 2021-11-29 ENCOUNTER — Other Ambulatory Visit (HOSPITAL_BASED_OUTPATIENT_CLINIC_OR_DEPARTMENT_OTHER): Payer: Self-pay

## 2021-11-29 MED ORDER — BUPRENORPHINE HCL-NALOXONE HCL 4-1 MG SL FILM
ORAL_FILM | SUBLINGUAL | 0 refills | Status: DC
Start: 2021-11-29 — End: 2023-03-12
  Filled 2021-11-29 (×2): qty 28, 14d supply, fill #0
  Filled 2021-11-30: qty 14, 14d supply, fill #0
  Filled 2022-01-05: qty 14, 14d supply, fill #1

## 2021-11-29 MED ORDER — BUPRENORPHINE HCL-NALOXONE HCL 4-1 MG SL FILM
ORAL_FILM | SUBLINGUAL | 0 refills | Status: DC
Start: 2021-11-28 — End: 2021-11-29
  Filled 2021-11-29: qty 28, 14d supply, fill #0

## 2021-11-30 ENCOUNTER — Other Ambulatory Visit (HOSPITAL_BASED_OUTPATIENT_CLINIC_OR_DEPARTMENT_OTHER): Payer: Self-pay

## 2021-12-01 ENCOUNTER — Telehealth: Payer: Self-pay | Admitting: Adult Health

## 2021-12-01 ENCOUNTER — Other Ambulatory Visit (HOSPITAL_BASED_OUTPATIENT_CLINIC_OR_DEPARTMENT_OTHER): Payer: Self-pay

## 2021-12-01 ENCOUNTER — Other Ambulatory Visit: Payer: Self-pay

## 2021-12-01 DIAGNOSIS — F411 Generalized anxiety disorder: Secondary | ICD-10-CM

## 2021-12-01 MED ORDER — ALPRAZOLAM 0.5 MG PO TABS
0.5000 mg | ORAL_TABLET | Freq: Two times a day (BID) | ORAL | 0 refills | Status: DC | PRN
  Filled 2021-12-01: qty 60, 30d supply, fill #0

## 2021-12-01 NOTE — Telephone Encounter (Signed)
Pended.

## 2021-12-01 NOTE — Telephone Encounter (Signed)
Next visit is 12/16/21. Margaret Alvarado is requesting a refill on her Xanax called to:  [place] Phone:  [phone]  Fax:  [phone]    Per patient please delete the below pharmacies as she no longer uses them.   [facility] J[facility]:[id number] - [address], [address]  Phone:  [phone]  Fax:  [phone]     [id number] - [place], [address]  Phone:  [phone]  Fax:  [phone]

## 2021-12-02 ENCOUNTER — Other Ambulatory Visit (HOSPITAL_BASED_OUTPATIENT_CLINIC_OR_DEPARTMENT_OTHER): Payer: Self-pay

## 2021-12-16 ENCOUNTER — Ambulatory Visit (INDEPENDENT_AMBULATORY_CARE_PROVIDER_SITE_OTHER): Payer: Self-pay | Admitting: Adult Health

## 2021-12-16 DIAGNOSIS — Z91199 Patient's noncompliance with other medical treatment and regimen due to unspecified reason: Secondary | ICD-10-CM

## 2021-12-16 NOTE — Progress Notes (Signed)
Patient no show appointment. ? ?

## 2021-12-19 ENCOUNTER — Telehealth: Payer: Self-pay | Admitting: Adult Health

## 2021-12-19 ENCOUNTER — Other Ambulatory Visit: Payer: Self-pay

## 2021-12-19 ENCOUNTER — Other Ambulatory Visit (HOSPITAL_BASED_OUTPATIENT_CLINIC_OR_DEPARTMENT_OTHER): Payer: Self-pay

## 2021-12-19 DIAGNOSIS — F909 Attention-deficit hyperactivity disorder, unspecified type: Secondary | ICD-10-CM

## 2021-12-19 MED ORDER — METHYLPHENIDATE HCL 20 MG PO TABS
ORAL_TABLET | ORAL | 0 refills | Status: DC
  Filled 2021-12-19: qty 60, 30d supply, fill #0

## 2021-12-19 NOTE — Telephone Encounter (Signed)
Pended.

## 2021-12-19 NOTE — Telephone Encounter (Signed)
Margaret Alvarado called to make appt.  She missed her appt Friday.  She is now scheduled for 01/05/22.  She also needs refill of her Ritalin.  Thinks it is due Friday.  Please send to [facility] pt pharmacy.

## 2022-01-04 ENCOUNTER — Other Ambulatory Visit: Payer: Self-pay | Admitting: Psychiatry

## 2022-01-04 DIAGNOSIS — F411 Generalized anxiety disorder: Secondary | ICD-10-CM

## 2022-01-05 ENCOUNTER — Ambulatory Visit (INDEPENDENT_AMBULATORY_CARE_PROVIDER_SITE_OTHER): Payer: Self-pay | Admitting: Adult Health

## 2022-01-05 DIAGNOSIS — Z91199 Patient's noncompliance with other medical treatment and regimen due to unspecified reason: Secondary | ICD-10-CM

## 2022-01-05 NOTE — Telephone Encounter (Signed)
Margaret Alvarado returned our call to Margaret Alvarado her missed appt today.  She is scheduled for 7/26.  She was told she needed to be sure to make this appt. or refills will be affected.  She did ask to send in refills she needs now.

## 2022-01-05 NOTE — Telephone Encounter (Signed)
Please call to schedule appt. Patient was a no show the last 2 appts.

## 2022-01-05 NOTE — Telephone Encounter (Signed)
Lvm for pt to call back and schedule.  

## 2022-01-05 NOTE — Progress Notes (Signed)
Patient no show appointment. ? ?

## 2022-01-06 ENCOUNTER — Other Ambulatory Visit (HOSPITAL_BASED_OUTPATIENT_CLINIC_OR_DEPARTMENT_OTHER): Payer: Self-pay

## 2022-01-06 MED ORDER — ALPRAZOLAM 0.5 MG PO TABS
0.5000 mg | ORAL_TABLET | Freq: Two times a day (BID) | ORAL | 0 refills | Status: DC | PRN
  Filled 2022-01-06: qty 38, 19d supply, fill #0

## 2022-01-06 NOTE — Telephone Encounter (Signed)
We can send in enough until her appt.

## 2022-01-06 NOTE — Telephone Encounter (Signed)
Changed quantity

## 2022-01-09 ENCOUNTER — Other Ambulatory Visit (HOSPITAL_BASED_OUTPATIENT_CLINIC_OR_DEPARTMENT_OTHER): Payer: Self-pay

## 2022-01-11 ENCOUNTER — Other Ambulatory Visit (HOSPITAL_BASED_OUTPATIENT_CLINIC_OR_DEPARTMENT_OTHER): Payer: Self-pay

## 2022-01-16 ENCOUNTER — Other Ambulatory Visit (HOSPITAL_BASED_OUTPATIENT_CLINIC_OR_DEPARTMENT_OTHER): Payer: Self-pay

## 2022-01-16 ENCOUNTER — Other Ambulatory Visit: Payer: Self-pay

## 2022-01-16 ENCOUNTER — Telehealth: Payer: Self-pay | Admitting: Adult Health

## 2022-01-16 DIAGNOSIS — F909 Attention-deficit hyperactivity disorder, unspecified type: Secondary | ICD-10-CM

## 2022-01-16 MED ORDER — METHYLPHENIDATE HCL 20 MG PO TABS
ORAL_TABLET | ORAL | 0 refills | Status: DC
  Filled 2022-01-16: qty 60, 30d supply, fill #0

## 2022-01-16 NOTE — Telephone Encounter (Signed)
Pended.

## 2022-01-16 NOTE — Telephone Encounter (Signed)
Pt called at 2:13 pm asking for a refill on her ritalin 20 mg. Pharmacy is [facility]. Next appt 7/26

## 2022-01-25 ENCOUNTER — Encounter: Payer: Self-pay | Admitting: Adult Health

## 2022-01-25 ENCOUNTER — Ambulatory Visit (INDEPENDENT_AMBULATORY_CARE_PROVIDER_SITE_OTHER): Payer: 59 | Admitting: Adult Health

## 2022-01-25 ENCOUNTER — Other Ambulatory Visit (HOSPITAL_BASED_OUTPATIENT_CLINIC_OR_DEPARTMENT_OTHER): Payer: Self-pay

## 2022-01-25 DIAGNOSIS — F331 Major depressive disorder, recurrent, moderate: Secondary | ICD-10-CM | POA: Diagnosis not present

## 2022-01-25 DIAGNOSIS — F909 Attention-deficit hyperactivity disorder, unspecified type: Secondary | ICD-10-CM

## 2022-01-25 DIAGNOSIS — F422 Mixed obsessional thoughts and acts: Secondary | ICD-10-CM | POA: Diagnosis not present

## 2022-01-25 DIAGNOSIS — F411 Generalized anxiety disorder: Secondary | ICD-10-CM

## 2022-01-25 DIAGNOSIS — F4310 Post-traumatic stress disorder, unspecified: Secondary | ICD-10-CM

## 2022-01-25 DIAGNOSIS — R69 Illness, unspecified: Secondary | ICD-10-CM | POA: Diagnosis not present

## 2022-01-25 MED ORDER — METHYLPHENIDATE HCL 20 MG PO TABS
ORAL_TABLET | ORAL | 0 refills | Status: DC
  Filled 2022-01-25 – 2022-02-09 (×2): qty 60, fill #0
  Filled 2022-02-14: qty 60, 30d supply, fill #0

## 2022-01-25 MED ORDER — SERTRALINE HCL 100 MG PO TABS
100.0000 mg | ORAL_TABLET | Freq: Every day | ORAL | 5 refills | Status: DC
  Filled 2022-01-25 – 2022-02-04 (×2): qty 30, 30d supply, fill #0

## 2022-01-25 MED ORDER — ALPRAZOLAM 0.5 MG PO TABS
ORAL_TABLET | ORAL | 2 refills | Status: DC
  Filled 2022-01-25: qty 60, 30d supply, fill #0
  Filled 2022-02-19 – 2022-02-27 (×2): qty 60, 30d supply, fill #1

## 2022-01-25 MED ORDER — METHYLPHENIDATE HCL 20 MG PO TABS: 20.0000 mg | ORAL_TABLET | Freq: Two times a day (BID) | ORAL | 0 refills | Status: DC

## 2022-01-25 MED ORDER — BUPROPION HCL ER (XL) 150 MG PO TB24
150.0000 mg | ORAL_TABLET | Freq: Every morning | ORAL | 5 refills | Status: DC
  Filled 2022-01-25 – 2022-02-04 (×2): qty 30, 30d supply, fill #0

## 2022-01-25 MED ORDER — METHYLPHENIDATE HCL 20 MG PO TABS
20.0000 mg | ORAL_TABLET | Freq: Two times a day (BID) | ORAL | 0 refills | Status: DC
  Filled 2022-04-21: qty 60, 30d supply, fill #0

## 2022-01-25 NOTE — Progress Notes (Signed)
Margaret Alvarado [id number] 03/20/1986 36 y.o.  Subjective:   Patient ID:  Margaret Alvarado is a 36 y.o. (DOB February 14, 1986) female.  Chief Complaint: No chief complaint on file.   HPI Margaret Alvarado presents to the office today for follow-up of GAD, MDD, PTSD, ADHD, obsessional thoughts.  Describes mood today as "ok". Pleasant. Denies tearfulness. Mood symptoms - reports decreased irritability and depression. Feels anxious at times. Decreased worry and rumination. Increased situational stressors. She and husband have separated - planning to divorce. Her son is in a mental health facility. Daughter starting college in 2 weeks. Stating "I'm hanging in there". Feels like medications are helpful. Stable interest and motivation. Taking medications as prescribed.  Energy levels lower. Active, does not have a regular exercise routine.   Enjoys some usual interests and activities. Married. Lives with her husband and 3 children - teenagers 59 (s), 17(s) and 18 (d). Family local. Spending time with family. Appetite adequate. Weight stable - 150 pounds. Sleeping better some nights than others - up and down. Averages 5 to 6 hours.  Focus and concentration difficulties with loss of sleep. Completing tasks. Managing aspects of household. Works full time at [employer] 11 to 8 - hoping to change shifts.  Denies SI or HI.  Denies AH or VH.   AUDIT    Flowsheet Row Office Visit from 10/12/2020 in [facility]  Alcohol Use Disorder Identification Test Final Score (AUDIT) 3      GAD-7    Flowsheet Row Office Visit from 10/12/2020 in [facility]  Total GAD-7 Score 15      PHQ2-9    Flowsheet Row Office Visit from 10/12/2020 in [facility]  PHQ-2 Total Score 0  PHQ-9 Total Score 3      Flowsheet Row ED from 10/05/2020 in [facility] RISK CATEGORY No Risk        Review of Systems:  Review of Systems  Musculoskeletal:  Negative for gait problem.  Neurological:  Negative for tremors.  Psychiatric/Behavioral:         Please refer to HPI    Medications: I have reviewed the patient's current medications.  Current Outpatient Medications  Medication Sig Dispense Refill   [START ON 02/22/2022] methylphenidate (RITALIN) 20 MG tablet Take 1 tablet (20 mg total) by mouth 2 (two) times daily. 60 tablet 0   [START ON 03/22/2022] methylphenidate (RITALIN) 20 MG tablet Take 1 tablet (20 mg total) by mouth 2 (two) times daily. 30 tablet 0   ALPRAZolam (XANAX) 0.5 MG tablet TAKE 1 TABLET BY MOUTH TWICE A DAY AS NEEDED FOR ANXIETY 60 tablet 2   Buprenorphine HCl-Naloxone HCl 4-1 MG FILM Dissolve 1 film under the tongue 2 times daily. **Max daily dose: 2 films** 28 each 0   buPROPion (WELLBUTRIN XL) 150 MG 24 hr tablet Take 1 tablet (150 mg total) by mouth every morning. 30 tablet 5   cephALEXin (KEFLEX) 500 MG capsule Take 1 capsule by mouth 2 times daily 10 capsule 0   fluticasone (FLONASE) 50 MCG/ACT nasal spray Place 2 sprays into both nostrils daily. 16 g 6   methylphenidate (RITALIN) 20 MG tablet Take 1 tablet by mouth 2 times daily 60 tablet 0   sertraline (ZOLOFT) 100 MG tablet Take 1 tablet by mouth daily 30 tablet 5   sertraline (ZOLOFT) 100 MG tablet Take 1 tablet (100 mg total) by  mouth daily. 30 tablet 5   sulfamethoxazole-trimethoprim (BACTRIM DS) 800-160 MG tablet Take 1 (one) tablet by mouth two times daily, max daily dose of 2 Tablet 14 tablet 0   No current facility-administered medications for this visit.    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   Please see review of systems for further details on the patient's review from today.   Objective:   Physical Exam:  There were no vitals taken for this visit.  Physical Exam Constitutional:      General: She is not in acute distress. Musculoskeletal:        General: No deformity.  Neurological:     Mental Status: She is alert and oriented to person, place, and time.     Coordination: Coordination normal.  Psychiatric:        Attention and Perception: Attention and perception normal. She does not perceive auditory or visual hallucinations.        Mood and Affect: Mood normal. Mood is not anxious or depressed. Affect is not labile, blunt, angry or inappropriate.        Speech: Speech normal.        Behavior: Behavior normal.        Thought Content: Thought content normal. Thought content is not paranoid or delusional. Thought content does not include homicidal or suicidal ideation. Thought content does not include homicidal or suicidal plan.        Cognition and Memory: Cognition and memory normal.        Judgment: Judgment normal.     Comments: Insight intact     Lab Review:     Component Value Date/Time   NA 138 10/12/2020 1057   K 4.4 10/12/2020 1057   CL 105 10/12/2020 1057   CO2 26 10/12/2020 1057   GLUCOSE 88 10/12/2020 1057   BUN 15 10/12/2020 1057   CREATININE 0.67 10/12/2020 1057   CALCIUM 8.9 10/12/2020 1057   PROT 6.8 10/12/2020 1057   ALBUMIN 4.1 10/12/2020 1057   AST 21 10/12/2020 1057   ALT 26 10/12/2020 1057   ALKPHOS 59 10/12/2020 1057   BILITOT 0.4 10/12/2020 1057   GFRNONAA >60 10/12/2020 1057       Component Value Date/Time   WBC 8.7 10/12/2020 1057   RBC 4.37 10/12/2020 1057   HGB 13.7 10/12/2020 1057   HCT 41.5 10/12/2020 1057   PLT 229 10/12/2020 1057   MCV 95.0 10/12/2020 1057   MCH 31.4 10/12/2020 1057   MCHC 33.0 10/12/2020 1057   RDW 11.7 10/12/2020 1057   LYMPHSABS 2.1  10/12/2020 1057   MONOABS 0.6 10/12/2020 1057   EOSABS 0.3 10/12/2020 1057   BASOSABS 0.0 10/12/2020 1057    No results found for: "POCLITH", "LITHIUM"   No results found for: "PHENYTOIN", "PHENOBARB", "VALPROATE", "CBMZ"   .res Assessment: Plan:    Plan:  PDMP reviewed  1. Wellbutrin XL 150mg  every morning 2. Xanax 0.5mg  BID 3. Zoloft 150mg  daily  4. Ritalin 20mg  BID  BP WNL  RTC 3 months  Patient advised to contact office with any questions, adverse effects, or acute worsening in signs and symptoms.  Discussed potential benefits, risk, and  side effects of benzodiazepines to include potential risk of tolerance and dependence, as well as possible drowsiness.  Advised patient not to drive if experiencing drowsiness and to take lowest possible effective dose to minimize risk of dependence and tolerance.  Diagnoses and all orders for this visit:  Mixed obsessional thoughts and acts  Generalized anxiety disorder -     ALPRAZolam (XANAX) 0.5 MG tablet; TAKE 1 TABLET BY MOUTH TWICE A DAY AS NEEDED FOR ANXIETY  Major depressive disorder, recurrent episode, moderate (HCC) -     buPROPion (WELLBUTRIN XL) 150 MG 24 hr tablet; Take 1 tablet (150 mg total) by mouth every morning. -     sertraline (ZOLOFT) 100 MG tablet; Take 1 tablet (100 mg total) by mouth daily.  Attention deficit hyperactivity disorder (ADHD), unspecified ADHD type -     methylphenidate (RITALIN) 20 MG tablet; Take 1 tablet by mouth 2 times daily -     methylphenidate (RITALIN) 20 MG tablet; Take 1 tablet (20 mg total) by mouth 2 (two) times daily. -     methylphenidate (RITALIN) 20 MG tablet; Take 1 tablet (20 mg total) by mouth 2 (two) times daily.  PTSD (post-traumatic stress disorder)     Please see After Visit Summary for patient specific instructions.  Future Appointments  Date Time Provider Department Center  04/26/2022 10:00 AM Denece Shearer, Thereasa Solo, NP [facility] None    No orders of the defined  types were placed in this encounter.   -------------------------------

## 2022-02-04 ENCOUNTER — Telehealth: Payer: 59 | Admitting: Nurse Practitioner

## 2022-02-04 ENCOUNTER — Ambulatory Visit: Payer: 59

## 2022-02-04 DIAGNOSIS — J069 Acute upper respiratory infection, unspecified: Secondary | ICD-10-CM | POA: Diagnosis not present

## 2022-02-04 MED ORDER — BENZONATATE 100 MG PO CAPS: 100.0000 mg | ORAL_CAPSULE | Freq: Three times a day (TID) | ORAL | 0 refills | Status: DC | PRN

## 2022-02-04 MED ORDER — FLUTICASONE PROPIONATE 50 MCG/ACT NA SUSP: 2.0000 | Freq: Every day | NASAL | 6 refills | Status: DC

## 2022-02-04 MED ORDER — AMOXICILLIN-POT CLAVULANATE 875-125 MG PO TABS: 1.0000 | ORAL_TABLET | Freq: Two times a day (BID) | ORAL | 0 refills | Status: DC

## 2022-02-04 NOTE — Progress Notes (Signed)

## 2022-02-06 ENCOUNTER — Other Ambulatory Visit (HOSPITAL_BASED_OUTPATIENT_CLINIC_OR_DEPARTMENT_OTHER): Payer: Self-pay

## 2022-02-09 ENCOUNTER — Other Ambulatory Visit (HOSPITAL_BASED_OUTPATIENT_CLINIC_OR_DEPARTMENT_OTHER): Payer: Self-pay

## 2022-02-14 ENCOUNTER — Other Ambulatory Visit (HOSPITAL_BASED_OUTPATIENT_CLINIC_OR_DEPARTMENT_OTHER): Payer: Self-pay

## 2022-02-20 ENCOUNTER — Other Ambulatory Visit (HOSPITAL_BASED_OUTPATIENT_CLINIC_OR_DEPARTMENT_OTHER): Payer: Self-pay

## 2022-02-21 ENCOUNTER — Other Ambulatory Visit (HOSPITAL_BASED_OUTPATIENT_CLINIC_OR_DEPARTMENT_OTHER): Payer: Self-pay

## 2022-02-21 ENCOUNTER — Encounter: Payer: Self-pay | Admitting: Radiology

## 2022-02-27 ENCOUNTER — Other Ambulatory Visit (HOSPITAL_BASED_OUTPATIENT_CLINIC_OR_DEPARTMENT_OTHER): Payer: Self-pay

## 2022-03-02 ENCOUNTER — Telehealth: Payer: 59 | Admitting: Physician Assistant

## 2022-03-02 DIAGNOSIS — U071 COVID-19: Secondary | ICD-10-CM

## 2022-03-02 MED ORDER — ALBUTEROL SULFATE HFA 108 (90 BASE) MCG/ACT IN AERS: 2.0000 | INHALATION_SPRAY | Freq: Four times a day (QID) | RESPIRATORY_TRACT | 0 refills | Status: DC | PRN

## 2022-03-02 MED ORDER — PROMETHAZINE-DM 6.25-15 MG/5ML PO SYRP: 2.5000 mL | ORAL_SOLUTION | Freq: Four times a day (QID) | ORAL | 0 refills | Status: DC | PRN

## 2022-03-02 NOTE — Progress Notes (Signed)
E-Visit  for Positive Covid Test Result  We are sorry you are not feeling well. We are here to help!  You have tested positive for COVID-19, meaning that you were infected with the novel coronavirus and could give the virus to others.  It is vitally important that you stay home so you do not spread it to others.      Please continue isolation at home, for at least 10 days since the start of your symptoms and until you have had 24 hours with no fever (without taking a fever reducer) and with improving of symptoms.  If you have no symptoms but tested positive (or all symptoms resolve after 5 days and you have no fever) you can leave your house but continue to wear a mask around others for an additional 5 days. If you have a fever,continue to stay home until you have had 24 hours of no fever. Most cases improve 5-10 days from onset but we have seen a small number of patients who have gotten worse after the 10 days.  Please be sure to watch for worsening symptoms and remain taking the proper precautions.   Go to the nearest hospital ED for assessment if fever/cough/breathlessness are severe or illness seems like a threat to life.    The following symptoms may appear 2-14 days after exposure: Fever Cough Shortness of breath or difficulty breathing Chills Repeated shaking with chills Muscle pain Headache Sore throat New loss of taste or smell Fatigue Congestion or runny nose Nausea or vomiting Diarrhea  You have been enrolled in MyChart Home Monitoring for COVID-19. Daily you will receive a questionnaire within the MyChart website. Our COVID-19 response team will be monitoring your responses daily.  You can use medication such as  prescription inhaler called Albuterol MDI 90 mcg /actuation 2 puffs every 4 hours as needed for shortness of breath, wheezing, cough and prescription cough medication called Phenergan DM 6.25 mg/15 mg. You make take one teaspoon / 5 ml every 4-6 hours as needed  for cough  You may also take acetaminophen (Tylenol) as needed for fever.  HOME CARE: Only take medications as instructed by your medical team. Drink plenty of fluids and get plenty of rest. A steam or ultrasonic humidifier can help if you have congestion.   GET HELP RIGHT AWAY IF YOU HAVE EMERGENCY WARNING SIGNS.  Call 911 or proceed to your closest emergency facility if: You develop worsening high fever. Trouble breathing Bluish lips or face Persistent pain or pressure in the chest New confusion Inability to wake or stay awake You cough up blood. Your symptoms become more severe Inability to hold down food or fluids  This list is not all possible symptoms. Contact your medical provider for any symptoms that are severe or concerning to you.    Your e-visit answers were reviewed by a board certified advanced clinical practitioner to complete your personal care plan.  Depending on the condition, your plan could have included both over the counter or prescription medications.  If there is a problem please reply once you have received a response from your provider.  Your safety is important to [place].  If you have drug allergies check your prescription carefully.    You can use MyChart to ask questions about today's visit, request a non-urgent call back, or ask for a work or school excuse for 24 hours related to this e-Visit. If it has been greater than 24 hours you will need to follow  up with your provider, or enter a new e-Visit to address those concerns. You will get an e-mail in the next two days asking about your experience.  I hope that your e-visit has been valuable and will speed your recovery. Thank you for using e-visits.

## 2022-03-02 NOTE — Progress Notes (Signed)
I have spent 5 minutes in review of e-visit questionnaire, review and updating patient chart, medical decision making and response to patient.   Kawhi Diebold Cody Crixus Mcaulay, PA-C    

## 2022-03-16 ENCOUNTER — Telehealth: Payer: Self-pay | Admitting: Adult Health

## 2022-03-16 NOTE — Telephone Encounter (Signed)
Last filled 8/15, has a RF available but it is dated for a later date. Will pend new Rx.

## 2022-03-16 NOTE — Telephone Encounter (Signed)
Patient lvm requesting a refill on her generic Ritalin. Patient has a follow up scheduled 10/25  Contact information # [phone]

## 2022-03-17 ENCOUNTER — Other Ambulatory Visit (HOSPITAL_BASED_OUTPATIENT_CLINIC_OR_DEPARTMENT_OTHER): Payer: Self-pay

## 2022-03-17 ENCOUNTER — Other Ambulatory Visit: Payer: Self-pay

## 2022-03-17 DIAGNOSIS — F909 Attention-deficit hyperactivity disorder, unspecified type: Secondary | ICD-10-CM

## 2022-03-17 MED ORDER — METHYLPHENIDATE HCL 20 MG PO TABS
20.0000 mg | ORAL_TABLET | Freq: Two times a day (BID) | ORAL | 0 refills | Status: DC
  Filled 2022-03-17: qty 60, 30d supply, fill #0

## 2022-03-17 NOTE — Telephone Encounter (Signed)
Pended.

## 2022-03-18 ENCOUNTER — Telehealth: Payer: 59 | Admitting: Nurse Practitioner

## 2022-03-18 DIAGNOSIS — L739 Follicular disorder, unspecified: Secondary | ICD-10-CM | POA: Diagnosis not present

## 2022-03-19 MED ORDER — MUPIROCIN 2 % EX OINT
1.0000 | TOPICAL_OINTMENT | Freq: Two times a day (BID) | CUTANEOUS | 0 refills | Status: DC
  Filled 2022-03-19 – 2022-04-04 (×2): qty 22, 11d supply, fill #0

## 2022-03-19 NOTE — Progress Notes (Signed)
I have spent 5 minutes in review of e-visit questionnaire, review and updating patient chart, medical decision making and response to patient.  ° °Baylin Cabal W Kong Packett, NP ° °  °

## 2022-03-19 NOTE — Progress Notes (Signed)
  E Visit for Folliculitis   We are sorry you are not feeling well.  Here is how we plan to help!  Based on what you have shared with me it looks like you have folliculitis.  Folliculitis refers to inflammation of the superficial or deep portio of the hair follicle.  It can be infectious or non-infectious. Various bacteria, fungi, viruses, and parasites can cause infectious folliculis  Based upon what you have shared with me it looks like you have a bacterial follicultits.  Folliculitis is inflammation of the hair follicles that can be caused by a superficial infection of the skin and is treated with an antibiotic. I have prescribed: and Topical mupiricin     HOME CARE: Apply a warm, moist washcloth or compress using a saltwater solution (1 teaspoon of table salt to 2 cups water) Apply over the counter antibiotic cream, gel or wash  Apply soothing lotions such as oatmeal lotion or over the counter hydrocortisone cream Clean the affected skin twice daily with antibacterial soap. Use clean washcloth and towel each time and do not share with anyone.  Wash these items and clothes that have touched the area with hot soapy water. Protect the skin. If possible avoid shaving.  If you must shave, try an Copy.  When done, rinse skin with warm water and apply moisturizer.  GET HELP RIGHT AWAY IF: You have extensive skin involvement or the symptoms return after treatment Symptoms don't go away after treatment. Severe itching that persists. If you rash spreads or swells. If you rash begins to smell. If it blisters and opens or develops a yellow-brown crust. You develop a fever. You have a sore throat. You become short of breath.  MAKE SURE YOU:  Understand these instructions. Will watch your condition. Will get help right away if you are not doing well or get worse.  Thank you for choosing an e-visit.  Your e-visit answers were reviewed by a board certified advanced clinical  practitioner to complete your personal care plan. Depending upon the condition, your plan could have included both over the counter or prescription medications.  Please review your pharmacy choice. Make sure the pharmacy is open so you can pick up prescription now. If there is a problem, you may contact your provider through [employer] and have the prescription routed to another pharmacy.  Your safety is important to [place]. If you have drug allergies check your prescription carefully.   For the next 24 hours you can use MyChart to ask questions about today's visit, request a non-urgent call back, or ask for a work or school excuse. You will get an email in the next two days asking about your experience. I hope that your e-visit has been valuable and will speed your recovery.

## 2022-03-20 ENCOUNTER — Other Ambulatory Visit (HOSPITAL_BASED_OUTPATIENT_CLINIC_OR_DEPARTMENT_OTHER): Payer: Self-pay

## 2022-03-21 ENCOUNTER — Encounter: Payer: Self-pay | Admitting: Adult Health

## 2022-03-21 ENCOUNTER — Other Ambulatory Visit: Payer: Self-pay

## 2022-03-21 ENCOUNTER — Telehealth (INDEPENDENT_AMBULATORY_CARE_PROVIDER_SITE_OTHER): Payer: 59 | Admitting: Adult Health

## 2022-03-21 DIAGNOSIS — F909 Attention-deficit hyperactivity disorder, unspecified type: Secondary | ICD-10-CM

## 2022-03-21 DIAGNOSIS — F331 Major depressive disorder, recurrent, moderate: Secondary | ICD-10-CM | POA: Diagnosis not present

## 2022-03-21 DIAGNOSIS — F4310 Post-traumatic stress disorder, unspecified: Secondary | ICD-10-CM | POA: Diagnosis not present

## 2022-03-21 DIAGNOSIS — F422 Mixed obsessional thoughts and acts: Secondary | ICD-10-CM

## 2022-03-21 DIAGNOSIS — F411 Generalized anxiety disorder: Secondary | ICD-10-CM

## 2022-03-21 DIAGNOSIS — R69 Illness, unspecified: Secondary | ICD-10-CM | POA: Diagnosis not present

## 2022-03-21 MED ORDER — BUPROPION HCL ER (XL) 150 MG PO TB24: 150.0000 mg | ORAL_TABLET | Freq: Every morning | ORAL | 0 refills | Status: DC

## 2022-03-21 MED ORDER — SERTRALINE HCL 100 MG PO TABS: 100.0000 mg | ORAL_TABLET | Freq: Every day | ORAL | 0 refills | Status: DC

## 2022-03-21 MED ORDER — ALPRAZOLAM 0.5 MG PO TABS: ORAL_TABLET | ORAL | 0 refills | Status: DC

## 2022-03-21 MED ORDER — METHYLPHENIDATE HCL 20 MG PO TABS: 20.0000 mg | ORAL_TABLET | Freq: Two times a day (BID) | ORAL | 0 refills | Status: DC
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
## 2022-03-27 DIAGNOSIS — Z0289 Encounter for other administrative examinations: Secondary | ICD-10-CM

## 2022-03-31 ENCOUNTER — Other Ambulatory Visit (HOSPITAL_BASED_OUTPATIENT_CLINIC_OR_DEPARTMENT_OTHER): Payer: Self-pay

## 2022-04-04 ENCOUNTER — Other Ambulatory Visit: Payer: Self-pay

## 2022-04-04 ENCOUNTER — Other Ambulatory Visit (HOSPITAL_BASED_OUTPATIENT_CLINIC_OR_DEPARTMENT_OTHER): Payer: Self-pay

## 2022-04-04 ENCOUNTER — Telehealth: Payer: Self-pay | Admitting: Adult Health

## 2022-04-04 DIAGNOSIS — F411 Generalized anxiety disorder: Secondary | ICD-10-CM

## 2022-04-04 MED ORDER — ALPRAZOLAM 0.5 MG PO TABS
ORAL_TABLET | ORAL | 1 refills | Status: DC
  Filled 2022-04-04: qty 60, 30d supply, fill #0
  Filled 2022-05-04: qty 60, 30d supply, fill #1

## 2022-04-04 NOTE — Telephone Encounter (Signed)
Pended.

## 2022-04-04 NOTE — Telephone Encounter (Signed)
Pt called for refill of Xanax at 1:39p.  She would like it sent to [facility].  Next appt 10/25

## 2022-04-10 ENCOUNTER — Other Ambulatory Visit (HOSPITAL_BASED_OUTPATIENT_CLINIC_OR_DEPARTMENT_OTHER): Payer: Self-pay

## 2022-04-11 ENCOUNTER — Other Ambulatory Visit (HOSPITAL_BASED_OUTPATIENT_CLINIC_OR_DEPARTMENT_OTHER): Payer: Self-pay

## 2022-04-11 ENCOUNTER — Other Ambulatory Visit: Payer: Self-pay

## 2022-04-11 ENCOUNTER — Telehealth: Payer: Self-pay | Admitting: Adult Health

## 2022-04-11 DIAGNOSIS — F331 Major depressive disorder, recurrent, moderate: Secondary | ICD-10-CM

## 2022-04-11 DIAGNOSIS — F909 Attention-deficit hyperactivity disorder, unspecified type: Secondary | ICD-10-CM

## 2022-04-11 MED ORDER — METHYLPHENIDATE HCL 20 MG PO TABS
20.0000 mg | ORAL_TABLET | Freq: Two times a day (BID) | ORAL | 0 refills | Status: DC
  Filled 2022-04-14 – 2022-04-17 (×5): qty 60, 30d supply, fill #0

## 2022-04-11 MED ORDER — BUPROPION HCL ER (XL) 150 MG PO TB24
150.0000 mg | ORAL_TABLET | Freq: Every morning | ORAL | 0 refills | Status: DC
  Filled 2022-04-11: qty 30, 30d supply, fill #0

## 2022-04-11 NOTE — Telephone Encounter (Signed)
Pended.

## 2022-04-11 NOTE — Telephone Encounter (Signed)
Pt called at 3:54p.  She would like refill of Wellbutrin and generic Ritalin.  She only got 7 days of meds last time because she was out of town.  She is requesting a full script of both meds to be sent to [facility].  Next appt 10/25

## 2022-04-12 ENCOUNTER — Other Ambulatory Visit (HOSPITAL_BASED_OUTPATIENT_CLINIC_OR_DEPARTMENT_OTHER): Payer: Self-pay

## 2022-04-12 ENCOUNTER — Telehealth: Payer: Self-pay | Admitting: Adult Health

## 2022-04-12 ENCOUNTER — Other Ambulatory Visit: Payer: Self-pay

## 2022-04-12 DIAGNOSIS — F331 Major depressive disorder, recurrent, moderate: Secondary | ICD-10-CM

## 2022-04-12 MED ORDER — BUPROPION HCL ER (XL) 150 MG PO TB24
150.0000 mg | ORAL_TABLET | Freq: Every morning | ORAL | 0 refills | Status: DC
  Filled 2022-04-14: qty 30, 30d supply, fill #0

## 2022-04-12 NOTE — Telephone Encounter (Signed)
Wellbutrin sent.Ritalin filled 9/19 due 10/17

## 2022-04-12 NOTE — Telephone Encounter (Signed)
Next appt is 04/26/22. Requesting refills on Wellbutrin and Ritalin called to:  [facility]  Phone:  [phone]  Fax:  [phone]

## 2022-04-13 ENCOUNTER — Other Ambulatory Visit (HOSPITAL_BASED_OUTPATIENT_CLINIC_OR_DEPARTMENT_OTHER): Payer: Self-pay

## 2022-04-14 ENCOUNTER — Other Ambulatory Visit (HOSPITAL_BASED_OUTPATIENT_CLINIC_OR_DEPARTMENT_OTHER): Payer: Self-pay

## 2022-04-17 ENCOUNTER — Other Ambulatory Visit (HOSPITAL_BASED_OUTPATIENT_CLINIC_OR_DEPARTMENT_OTHER): Payer: Self-pay

## 2022-04-18 ENCOUNTER — Encounter: Payer: 59 | Admitting: Nurse Practitioner

## 2022-04-18 NOTE — Progress Notes (Signed)
Margaret Alvarado,  Thank you for submitting an e-visit request. We try our best to address as many things as we can over e-visit. Unfortunately this is not something we can appropriately assess. We would recommend a follow up with your primary care for in person assessment and workup.    NOTE: You will NOT be charged for this eVisit.  If you do not have a PCP, [facility] offers a free physician referral service available at [phone]. Our trained staff has the experience, knowledge and resources to put you in touch with a physician who is right for you.    If you are having a true medical emergency please call 911.   Your e-visit answers were reviewed by a board certified advanced clinical practitioner to complete your personal care plan.  Thank you for using e-Visits.

## 2022-04-21 ENCOUNTER — Other Ambulatory Visit (HOSPITAL_BASED_OUTPATIENT_CLINIC_OR_DEPARTMENT_OTHER): Payer: Self-pay

## 2022-04-26 ENCOUNTER — Encounter: Payer: Self-pay | Admitting: Adult Health

## 2022-04-26 ENCOUNTER — Telehealth (INDEPENDENT_AMBULATORY_CARE_PROVIDER_SITE_OTHER): Payer: 59 | Admitting: Adult Health

## 2022-04-26 DIAGNOSIS — F4310 Post-traumatic stress disorder, unspecified: Secondary | ICD-10-CM | POA: Diagnosis not present

## 2022-04-26 DIAGNOSIS — F411 Generalized anxiety disorder: Secondary | ICD-10-CM | POA: Diagnosis not present

## 2022-04-26 DIAGNOSIS — F909 Attention-deficit hyperactivity disorder, unspecified type: Secondary | ICD-10-CM | POA: Diagnosis not present

## 2022-04-26 DIAGNOSIS — F331 Major depressive disorder, recurrent, moderate: Secondary | ICD-10-CM

## 2022-04-26 DIAGNOSIS — R69 Illness, unspecified: Secondary | ICD-10-CM | POA: Diagnosis not present

## 2022-04-26 NOTE — Progress Notes (Signed)
Margaret Alvarado [id number] 10/26/1985 36 y.o.  Virtual Visit via Video Note  I connected with pt @ on 04/26/22 at 10:00 AM EDT by a video enabled telemedicine application and verified that I am speaking with the correct person using two identifiers.   I discussed the limitations of evaluation and management by telemedicine and the availability of in person appointments. The patient expressed understanding and agreed to proceed.  I discussed the assessment and treatment plan with the patient. The patient was provided an opportunity to ask questions and all were answered. The patient agreed with the plan and demonstrated an understanding of the instructions.   The patient was advised to call back or seek an in-person evaluation if the symptoms worsen or if the condition fails to improve as anticipated.  I provided 25 minutes of non-face-to-face time during this encounter.  The patient was located at home.  The provider was located at [facility].   Dorothyann Gibbs, NP   Subjective:   Patient ID:  Margaret Alvarado is a 36 y.o. (DOB 29-Oct-1985) female.  Chief Complaint: No chief complaint on file.   HPI Carrington Mullenax presents for follow-up of GAD, MDD, PTSD, ADHD, obsessional thoughts.  Describes mood today as "better". Pleasant. Denies tearfulness. Mood symptoms - reports anxiety - "not as bad as it was". Denies recent panic attacks. Denies irritability and depression. Reports "some" worry. Decreased situational stressors. Mother 24, living with her temporarily - relocating to area from [place]. Stating "I'm feeling better". Recovering from "illness". Feels like medications are helpful. Stable interest and motivation. Taking medications as prescribed.  Energy levels lower. Active, does not have a regular exercise routine.   Enjoys some usual interests and activities. Married. Lives with her husband and 3 children - teenagers 71 (s), 17(s) and 18 (d). Family local.  Spending time with family. Appetite adequate. Weight stable - 150 pounds. Sleep has improved. Typically averages 7 to 8 hours - more recently with illness..  Focus and concentration difficulties. Completing tasks. Managing aspects of household. Works full time at [employer] 11 to 8 - plan to return to work on 04/28/2021. Denies SI or HI.  Denies AH or VH.   Review of Systems:  Review of Systems  Musculoskeletal:  Negative for gait problem.  Neurological:  Negative for tremors.  Psychiatric/Behavioral:         Please refer to HPI    Medications: I have reviewed the patient's current medications.  Current Outpatient Medications  Medication Sig Dispense Refill   albuterol (VENTOLIN HFA) 108 (90 Base) MCG/ACT inhaler Inhale 2 puffs into the lungs every 6 (six) hours as needed for wheezing or shortness of breath. 8 g 0   ALPRAZolam (XANAX) 0.5 MG tablet TAKE 1 TABLET BY MOUTH TWICE A DAY AS NEEDED FOR ANXIETY 60 tablet 1   amoxicillin-clavulanate (AUGMENTIN) 875-125 MG tablet Take 1 tablet by mouth 2 (two) times daily. 14 tablet 0   benzonatate (TESSALON PERLES) 100 MG capsule Take 1 capsule (100 mg total) by mouth 3 (three) times daily as needed for cough. 20 capsule 0   Buprenorphine HCl-Naloxone HCl 4-1 MG FILM Dissolve 1 film under the tongue 2 times daily. **Max daily dose: 2 films** 28 each 0   buPROPion (WELLBUTRIN XL) 150 MG 24 hr tablet Take 1 tablet (150 mg total) by mouth every morning. 30 tablet 0   cephALEXin (KEFLEX) 500 MG capsule Take 1 capsule by mouth 2 times daily 10 capsule 0   fluticasone (FLONASE) 50 MCG/ACT  nasal spray Place 2 sprays into both nostrils daily. 16 g 6   methylphenidate (RITALIN) 20 MG tablet Take 1 tablet (20 mg total) by mouth 2 (two) times daily. 60 tablet 0   methylphenidate (RITALIN) 20 MG tablet Take 1 tablet (20 mg total) by mouth 2 (two) times daily. 60 tablet 0   mupirocin ointment (BACTROBAN) 2 % Apply 1 Application topically 2 (two) times daily. 22  g 0   promethazine-dextromethorphan (PROMETHAZINE-DM) 6.25-15 MG/5ML syrup Take 2.5 mLs by mouth 4 (four) times daily as needed for cough. 118 mL 0   sertraline (ZOLOFT) 100 MG tablet Take 1 tablet by mouth daily 30 tablet 5   sertraline (ZOLOFT) 100 MG tablet Take 1 tablet (100 mg total) by mouth daily. 7 tablet 0   sulfamethoxazole-trimethoprim (BACTRIM DS) 800-160 MG tablet Take 1 (one) tablet by mouth two times daily, max daily dose of 2 Tablet 14 tablet 0   No current facility-administered medications for this visit.    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    Family History  Problem Relation Age of Onset   Basal cell carcinoma Mother    Hypertension Father    Heart disease Father    Diabetes Father    Heart disease Maternal Grandmother    Hypertension Maternal Grandmother    Stroke Maternal Grandmother     Social History   Socioeconomic History   Marital status: Married    Spouse name: Gaspar Bidding   Number of children: 4   Years of education: Not on file   Highest education level: Not on file  Occupational History   Occupation: Customer Service  Tobacco Use   Smoking status: Former    Packs/day: 0.50    Years: 20.00    Total pack years: 10.00    Types: Cigarettes    Quit date: 2020    Years since quitting: 3.8   Smokeless tobacco: Never  Vaping Use   Vaping Use: Never used  Substance and Sexual Activity   Alcohol use: Yes    Comment: occassional   Drug use: Not Currently   Sexual activity: Yes    Partners: Male    Birth control/protection: Surgical  Other Topics Concern   Not on file  Social History Narrative   Not on file   Social Determinants of Health   Financial Resource Strain: Not on file  Food Insecurity: Not on file  Transportation Needs: Not on file   Physical Activity: Insufficiently Active (10/12/2020)   Exercise Vital Sign    Days of Exercise per Week: 4 days    Minutes of Exercise per Session: 30 min  Stress: Stress Concern Present (10/12/2020)   [facility]    Feeling of Stress : Very much  Social Connections: Not on file  Intimate Partner Violence: Not At Risk (10/12/2020)   Humiliation, Afraid, Rape, and Kick questionnaire    Fear of Current or Ex-Partner: No    Emotionally Abused: No    Physically Abused: No    Sexually Abused: No    Past Medical History, Surgical history, Social history, and Family history were reviewed and updated as appropriate.   Please see review of systems for further details on the patient's review from today.   Objective:  Physical Exam:  There were no vitals taken for this visit.  Physical Exam Constitutional:      General: She is not in acute distress. Musculoskeletal:        General: No deformity.  Neurological:     Mental Status: She is alert and oriented to person, place, and time.     Coordination: Coordination normal.  Psychiatric:        Attention and Perception: Attention and perception normal. She does not perceive auditory or visual hallucinations.        Mood and Affect: Mood normal. Mood is not anxious or depressed. Affect is not labile, blunt, angry or inappropriate.        Speech: Speech normal.        Behavior: Behavior normal.        Thought Content: Thought content normal. Thought content is not paranoid or delusional. Thought content does not include homicidal or suicidal ideation. Thought content does not include homicidal or suicidal plan.        Cognition and Memory: Cognition and memory normal.        Judgment: Judgment normal.     Comments: Insight intact     Lab Review:     Component Value Date/Time   NA 138 10/12/2020 1057   K 4.4 10/12/2020 1057   CL 105 10/12/2020 1057   CO2 26 10/12/2020  1057   GLUCOSE 88 10/12/2020 1057   BUN 15 10/12/2020 1057   CREATININE 0.67 10/12/2020 1057   CALCIUM 8.9 10/12/2020 1057   PROT 6.8 10/12/2020 1057   ALBUMIN 4.1 10/12/2020 1057   AST 21 10/12/2020 1057   ALT 26 10/12/2020 1057   ALKPHOS 59 10/12/2020 1057   BILITOT 0.4 10/12/2020 1057   GFRNONAA >60 10/12/2020 1057       Component Value Date/Time   WBC 8.7 10/12/2020 1057   RBC 4.37 10/12/2020 1057   HGB 13.7 10/12/2020 1057   HCT 41.5 10/12/2020 1057   PLT 229 10/12/2020 1057   MCV 95.0 10/12/2020 1057   MCH 31.4 10/12/2020 1057   MCHC 33.0 10/12/2020 1057   RDW 11.7 10/12/2020 1057   LYMPHSABS 2.1 10/12/2020 1057   MONOABS 0.6 10/12/2020 1057   EOSABS 0.3 10/12/2020 1057   BASOSABS 0.0 10/12/2020 1057    No results found for: "POCLITH", "LITHIUM"   No results found for: "PHENYTOIN", "PHENOBARB", "VALPROATE", "CBMZ"   .res Assessment: Plan:    Plan:  PDMP reviewed  Continue:  1. Wellbutrin XL 150mg  every morning 2. Xanax 0.5mg  BID 3. Zoloft 150mg  daily  4. Ritalin 20mg  BID  Monitor BP between visits.  RTC 4 weeks  Out of work 03/17/2022 through 04/17/2022. Will discuss a return to work at visit on 04/17/2022.   Patient advised to contact office with any questions, adverse effects, or acute worsening in signs and symptoms.  Discussed potential benefits, risk, and side effects of benzodiazepines to include potential risk of tolerance and dependence, as well as possible drowsiness. Advised patient not to drive if experiencing drowsiness and to take lowest possible effective dose to minimize risk of dependence and tolerance.  Diagnoses and all orders for this visit:  Major depressive disorder, recurrent episode, moderate (HCC)  Generalized anxiety disorder  Attention deficit hyperactivity disorder (ADHD), unspecified ADHD type  PTSD (post-traumatic stress disorder)     Please see After Visit Summary for patient specific instructions.  No future  appointments.   No orders of the defined types were placed in this encounter.     -------------------------------

## 2022-05-04 ENCOUNTER — Other Ambulatory Visit: Payer: Self-pay | Admitting: Adult Health

## 2022-05-04 DIAGNOSIS — F331 Major depressive disorder, recurrent, moderate: Secondary | ICD-10-CM

## 2022-05-04 DIAGNOSIS — F909 Attention-deficit hyperactivity disorder, unspecified type: Secondary | ICD-10-CM

## 2022-05-05 ENCOUNTER — Other Ambulatory Visit (HOSPITAL_BASED_OUTPATIENT_CLINIC_OR_DEPARTMENT_OTHER): Payer: Self-pay

## 2022-05-05 MED ORDER — BUPROPION HCL ER (XL) 150 MG PO TB24
150.0000 mg | ORAL_TABLET | Freq: Every morning | ORAL | 0 refills | Status: DC
  Filled 2022-05-05: qty 30, 30d supply, fill #0
  Filled ????-??-??: fill #0

## 2022-05-15 ENCOUNTER — Telehealth: Payer: 59 | Admitting: Physician Assistant

## 2022-05-15 ENCOUNTER — Other Ambulatory Visit (HOSPITAL_BASED_OUTPATIENT_CLINIC_OR_DEPARTMENT_OTHER): Payer: Self-pay

## 2022-05-15 DIAGNOSIS — R202 Paresthesia of skin: Secondary | ICD-10-CM

## 2022-05-15 DIAGNOSIS — M5441 Lumbago with sciatica, right side: Secondary | ICD-10-CM

## 2022-05-15 MED ORDER — METHYLPHENIDATE HCL 20 MG PO TABS
20.0000 mg | ORAL_TABLET | Freq: Two times a day (BID) | ORAL | 0 refills | Status: DC
  Filled 2022-05-15: qty 60, 30d supply, fill #0

## 2022-05-16 NOTE — Progress Notes (Signed)
Because of severity of pain and nerve involvement with mention of areas of numbness/altered sensation and urinary changed which can indicate more significant nerve irritation or compression, I feel your condition warrants further evaluation and I recommend that you be seen in a face to face visit.   NOTE: There will be NO CHARGE for this eVisit   If you are having a true medical emergency please call 911.      For an urgent face to face visit, [facility] has seven urgent care centers for your convenience:     [facility] at [facility] Directions [phone] [address] [place], [place] [address]    [facility]) Get Driving Directions [phone] [address] [place], [place] [address]  [facility]) Get Driving Directions [phone] [address] [place],  [place]  [address]  [facility] at [facility] Directions  [phone] [address] [place],  [place] [address]   [facility] Care at [facility] Get Driving Directions [phone] [address] [place], [place] [address]   [facility] Urgent Care at [facility] Get Driving Directions  [phone] [address] [place], [place] [address]   [facility] Care at [facility] Directions [phone] [address] [place], [place] [address]  Your MyChart E-visit questionnaire answers were reviewed by a board certified advanced clinical practitioner to complete your personal care plan based on your specific symptoms.  Thank you for using e-Visits.

## 2022-05-18 ENCOUNTER — Telehealth: Payer: Self-pay | Admitting: Adult Health

## 2022-05-18 NOTE — Telephone Encounter (Signed)
Pt lvm that she was having issues with her medication. Please call her at [phone]

## 2022-05-18 NOTE — Telephone Encounter (Signed)
Patient called to say that the Ritalin is no longer working. She said it lasts a couple of hours. She said she is peri-menopausal and she has also started a new job. She has an appt 11/27.

## 2022-05-19 NOTE — Telephone Encounter (Signed)
Patient notified

## 2022-05-27 ENCOUNTER — Telehealth: Payer: 59 | Admitting: Physician Assistant

## 2022-05-27 DIAGNOSIS — K047 Periapical abscess without sinus: Secondary | ICD-10-CM | POA: Diagnosis not present

## 2022-05-27 NOTE — Progress Notes (Signed)
E-Visit for Dental Pain  We are sorry that you are not feeling well.  Here is how we plan to help!  Based on what you have shared with me in the questionnaire, it sounds like you have a dental infection.  Clindamycin 300mg  4 times per day for days. This will help with any developing infection. I have also called in etodolac 500mg , twice daily with food x 4 days. This will help with the pain. We cannot call in anything stronger through telemedicine.  It is imperative that you see a dentist within 10 days of this eVisit to determine the cause of the dental pain and be sure it is adequately treated  A toothache or tooth pain is caused when the nerve in the root of a tooth or surrounding a tooth is irritated. Dental (tooth) infection, decay, injury, or loss of a tooth are the most common causes of dental pain. Pain may also occur after an extraction (tooth is pulled out). Pain sometimes originates from other areas and radiates to the jaw, thus appearing to be tooth pain.Bacteria growing inside your mouth can contribute to gum disease and dental decay, both of which can cause pain. A toothache occurs from inflammation of the central portion of the tooth called pulp. The pulp contains nerve endings that are very sensitive to pain. Inflammation to the pulp or pulpitis may be caused by dental cavities, trauma, and infection.    HOME CARE:   For toothaches: Over-the-counter pain medications such as acetaminophen or ibuprofen may be used. Take these as directed on the package while you arrange for a dental appointment. Avoid very cold or hot foods, because they may make the pain worse. You may get relief from biting on a cotton ball soaked in oil of cloves. You can get oil of cloves at most drug stores.  For jaw pain:  Aspirin may be helpful for problems in the joint of the jaw in adults. If pain happens every time you open your mouth widely, the temporomandibular joint (TMJ) may be the source of the  pain. Yawning or taking a large bite of food may worsen the pain. An appointment with your doctor or dentist will help you find the cause.     GET HELP RIGHT AWAY IF:  You have a high fever or chills If you have had a recent head or face injury and develop headache, light headedness, nausea, vomiting, or other symptoms that concern you after an injury to your face or mouth, you could have a more serious injury in addition to your dental injury. A facial rash associated with a toothache: This condition may improve with medication. Contact your doctor for them to decide what is appropriate. Any jaw pain occurring with chest pain: Although jaw pain is most commonly caused by dental disease, it is sometimes referred pain from other areas. People with heart disease, especially people who have had stents placed, people with diabetes, or those who have had heart surgery may have jaw pain as a symptom of heart attack or angina. If your jaw or tooth pain is associated with lightheadedness, sweating, or shortness of breath, you should see a doctor as soon as possible. Trouble swallowing or excessive pain or bleeding from gums: If you have a history of a weakened immune system, diabetes, or steroid use, you may be more susceptible to infections. Infections can often be more severe and extensive or caused by unusual organisms. Dental and gum infections in people with these conditions may  require more aggressive treatment. An abscess may need draining or IV antibiotics, for example.  MAKE SURE YOU   Understand these instructions. Will watch your condition. Will get help right away if you are not doing well or get worse.  Thank you for choosing an e-visit.  Your e-visit answers were reviewed by a board certified advanced clinical practitioner to complete your personal care plan. Depending upon the condition, your plan could have included both over the counter or prescription medications.  Please review your  pharmacy choice. Make sure the pharmacy is open so you can pick up prescription now. If there is a problem, you may contact your provider through [employer] and have the prescription routed to another pharmacy.  Your safety is important to [place]. If you have drug allergies check your prescription carefully.   For the next 24 hours you can use MyChart to ask questions about today's visit, request a non-urgent call back, or ask for a work or school excuse. You will get an email in the next two days asking about your experience. I hope that your e-visit has been valuable and will speed your recovery.   I have spent 5 minutes in review of e-visit questionnaire, review and updating patient chart, medical decision making and response to patient.   Sole Lengacher L Jamarria Real, PA

## 2022-05-29 ENCOUNTER — Ambulatory Visit (INDEPENDENT_AMBULATORY_CARE_PROVIDER_SITE_OTHER): Payer: Self-pay | Admitting: Adult Health

## 2022-05-29 DIAGNOSIS — F489 Nonpsychotic mental disorder, unspecified: Secondary | ICD-10-CM

## 2022-05-29 MED ORDER — ETODOLAC 500 MG PO TABS: 500.0000 mg | ORAL_TABLET | Freq: Two times a day (BID) | ORAL | 0 refills | Status: AC

## 2022-05-29 MED ORDER — CLINDAMYCIN HCL 300 MG PO CAPS: 300.0000 mg | ORAL_CAPSULE | Freq: Four times a day (QID) | ORAL | 0 refills | Status: AC

## 2022-05-29 NOTE — Progress Notes (Signed)
Patient no show appointment. ? ?

## 2022-05-31 ENCOUNTER — Other Ambulatory Visit: Payer: Self-pay | Admitting: Adult Health

## 2022-05-31 DIAGNOSIS — F411 Generalized anxiety disorder: Secondary | ICD-10-CM

## 2022-06-01 NOTE — Telephone Encounter (Signed)
Last filled 11/7, due 12/5

## 2022-06-04 ENCOUNTER — Other Ambulatory Visit: Payer: Self-pay | Admitting: Adult Health

## 2022-06-04 DIAGNOSIS — F331 Major depressive disorder, recurrent, moderate: Secondary | ICD-10-CM

## 2022-06-04 DIAGNOSIS — F909 Attention-deficit hyperactivity disorder, unspecified type: Secondary | ICD-10-CM

## 2022-06-05 ENCOUNTER — Other Ambulatory Visit (HOSPITAL_BASED_OUTPATIENT_CLINIC_OR_DEPARTMENT_OTHER): Payer: Self-pay

## 2022-06-05 NOTE — Telephone Encounter (Signed)
Please schedule appt

## 2022-06-06 ENCOUNTER — Other Ambulatory Visit (HOSPITAL_BASED_OUTPATIENT_CLINIC_OR_DEPARTMENT_OTHER): Payer: Self-pay

## 2022-06-06 MED ORDER — ALPRAZOLAM 0.5 MG PO TABS
0.5000 mg | ORAL_TABLET | Freq: Two times a day (BID) | ORAL | 0 refills | Status: DC | PRN
  Filled 2022-06-06: qty 60, 30d supply, fill #0

## 2022-06-08 NOTE — Telephone Encounter (Signed)
LVM to schedule appt

## 2022-06-10 ENCOUNTER — Ambulatory Visit: Payer: 59

## 2022-06-12 ENCOUNTER — Telehealth: Payer: 59 | Admitting: Physician Assistant

## 2022-06-12 ENCOUNTER — Other Ambulatory Visit (HOSPITAL_BASED_OUTPATIENT_CLINIC_OR_DEPARTMENT_OTHER): Payer: Self-pay

## 2022-06-12 DIAGNOSIS — J111 Influenza due to unidentified influenza virus with other respiratory manifestations: Secondary | ICD-10-CM

## 2022-06-12 DIAGNOSIS — R6889 Other general symptoms and signs: Secondary | ICD-10-CM | POA: Diagnosis not present

## 2022-06-12 MED ORDER — METHYLPHENIDATE HCL 20 MG PO TABS
20.0000 mg | ORAL_TABLET | Freq: Two times a day (BID) | ORAL | 0 refills | Status: DC
  Filled 2022-06-12: qty 60, 30d supply, fill #0

## 2022-06-12 MED ORDER — PROMETHAZINE-DM 6.25-15 MG/5ML PO SYRP
5.0000 mL | ORAL_SOLUTION | Freq: Four times a day (QID) | ORAL | 0 refills | Status: DC | PRN
  Filled 2022-06-12: qty 118, 6d supply, fill #0

## 2022-06-12 MED ORDER — FLUTICASONE PROPIONATE 50 MCG/ACT NA SUSP
2.0000 | Freq: Every day | NASAL | 0 refills | Status: DC
  Filled 2022-06-12: qty 16, 30d supply, fill #0

## 2022-06-12 MED ORDER — BUPROPION HCL ER (XL) 150 MG PO TB24
150.0000 mg | ORAL_TABLET | Freq: Every morning | ORAL | 0 refills | Status: DC
  Filled 2022-06-12: qty 30, 30d supply, fill #0

## 2022-06-12 MED ORDER — BENZONATATE 100 MG PO CAPS
100.0000 mg | ORAL_CAPSULE | Freq: Three times a day (TID) | ORAL | 0 refills | Status: DC | PRN
  Filled 2022-06-12: qty 30, 10d supply, fill #0

## 2022-06-12 NOTE — Telephone Encounter (Signed)
Margaret Alvarado called today at 9:15 ;to RS her appt.  She is now scheduled for 07/05/22.  She needs refill of her Wellbutrin and Ritalin.  Send to [facility]

## 2022-06-12 NOTE — Progress Notes (Signed)
E visit for Flu like symptoms   We are sorry that you are not feeling well.  Here is how we plan to help! Based on what you have shared with me it looks like you may have flu-like symptoms that should be watched but do not seem to indicate anti-viral treatment.  Influenza or "the flu" is   an infection caused by a respiratory virus. The flu virus is highly contagious and persons who did not receive their yearly flu vaccination may "catch" the flu from close contact.  We have anti-viral medications to treat the viruses that cause this infection. They are not a "cure" and only shorten the course of the infection. These prescriptions are most effective when they are given within the first 2 days of "flu" symptoms. Antiviral medication are indicated if you have a high risk of complications from the flu. You should  also consider an antiviral medication if you are in close contact with someone who is at risk. These medications can help patients avoid complications from the flu  but have side effects that you should know. Possible side effects from Tamiflu or oseltamivir include nausea, vomiting, diarrhea, dizziness, headaches, eye redness, sleep problems or other respiratory symptoms. You should not take Tamiflu if you have an allergy to oseltamivir or any to the ingredients in Tamiflu.  Based upon your symptoms and potential risk factors I recommend that you follow the flu symptoms recommendation that I have listed below.  This is an infection that is most likely caused by a virus. There are no specific treatments other than to help you with the symptoms until the infection runs its course.  We are sorry you are not feeling well.  Here is how we plan to help!  For nasal congestion, you may use an oral decongestants such as Mucinex D or if you have glaucoma or high blood pressure use plain Mucinex.  Saline nasal spray or nasal drops can help and can safely be used as often as needed for congestion.  For  your congestion, I have prescribed Fluticasone nasal spray one spray in each nostril twice a day  If you do not have a history of heart disease, hypertension, diabetes or thyroid disease, prostate/bladder issues or glaucoma, you may also use Sudafed to treat nasal congestion.  It is highly recommended that you consult with a pharmacist or your primary care physician to ensure this medication is safe for you to take.     If you have a cough, you may use cough suppressants such as Delsym and Robitussin.  If you have glaucoma or high blood pressure, you can also use Coricidin HBP.   For cough I have prescribed for you A prescription cough medication called Tessalon Perles 100 mg. You may take 1-2 capsules every 8 hours as needed for cough and Promethazine DM cough syrup Take 65mL every 6 hours as needed for cough.  If you have a sore or scratchy throat, use a saltwater gargle-  to  teaspoon of salt dissolved in a 4-ounce to 8-ounce glass of warm water.  Gargle the solution for approximately 15-30 seconds and then spit.  It is important not to swallow the solution.  You can also use throat lozenges/cough drops and Chloraseptic spray to help with throat pain or discomfort.  Warm or cold liquids can also be helpful in relieving throat pain.  For headache, pain or general discomfort, you can use Ibuprofen or Tylenol as directed.   Some authorities believe that zinc  sprays or the use of Echinacea may shorten the course of your symptoms.  ANYONE WHO HAS FLU SYMPTOMS SHOULD: Stay home. The flu is highly contagious and going out or to work exposes others! Be sure to drink plenty of fluids. Water is fine as well as fruit juices, sodas and electrolyte beverages. You may want to stay away from caffeine or alcohol. If you are nauseated, try taking small sips of liquids. How do you know if you are getting enough fluid? Your urine should be a pale yellow or almost colorless. Get rest. Taking a steamy shower or using  a humidifier may help nasal congestion and ease sore throat pain. Using a saline nasal spray works much the same way. Cough drops, hard candies and sore throat lozenges may ease your cough. Line up a caregiver. Have someone check on you regularly.   GET HELP RIGHT AWAY IF: You cannot keep down liquids or your medications. You become short of breath Your fell like you are going to pass out or loose consciousness. Your symptoms persist after you have completed your treatment plan MAKE SURE YOU  Understand these instructions. Will watch your condition. Will get help right away if you are not doing well or get worse.  Your e-visit answers were reviewed by a board certified advanced clinical practitioner to complete your personal care plan.  Depending on the condition, your plan could have included both over the counter or prescription medications.  If there is a problem please reply  once you have received a response from your provider.  Your safety is important to [place].  If you have drug allergies check your prescription carefully.    You can use MyChart to ask questions about today's visit, request a non-urgent call back, or ask for a work or school excuse for 24 hours related to this e-Visit. If it has been greater than 24 hours you will need to follow up with your provider, or enter a new e-Visit to address those concerns.  You will get an e-mail in the next two days asking about your experience.  I hope that your e-visit has been valuable and will speed your recovery. Thank you for using e-visits.  I have spent 5 minutes in review of e-visit questionnaire, review and updating patient chart, medical decision making and response to patient.   Margaretann Loveless, PA-C

## 2022-06-13 ENCOUNTER — Other Ambulatory Visit (HOSPITAL_BASED_OUTPATIENT_CLINIC_OR_DEPARTMENT_OTHER): Payer: Self-pay

## 2022-06-16 ENCOUNTER — Other Ambulatory Visit (HOSPITAL_BASED_OUTPATIENT_CLINIC_OR_DEPARTMENT_OTHER): Payer: Self-pay

## 2022-06-16 ENCOUNTER — Telehealth: Payer: Self-pay | Admitting: Adult Health

## 2022-06-16 ENCOUNTER — Other Ambulatory Visit: Payer: Self-pay

## 2022-06-16 DIAGNOSIS — F909 Attention-deficit hyperactivity disorder, unspecified type: Secondary | ICD-10-CM

## 2022-06-16 MED ORDER — METHYLPHENIDATE HCL 20 MG PO TABS
20.0000 mg | ORAL_TABLET | Freq: Two times a day (BID) | ORAL | 0 refills | Status: DC
  Filled 2022-06-16: qty 60, 30d supply, fill #0

## 2022-06-16 NOTE — Telephone Encounter (Signed)
Pt called and said that  the [facility] [place] [facility]'t have the [facility]italin but [facility] [place] [facility] it

## 2022-06-16 NOTE — Telephone Encounter (Signed)
Pended.

## 2022-06-29 ENCOUNTER — Other Ambulatory Visit: Payer: Self-pay | Admitting: Adult Health

## 2022-06-29 DIAGNOSIS — F411 Generalized anxiety disorder: Secondary | ICD-10-CM

## 2022-07-04 ENCOUNTER — Other Ambulatory Visit (HOSPITAL_BASED_OUTPATIENT_CLINIC_OR_DEPARTMENT_OTHER): Payer: Self-pay

## 2022-07-04 ENCOUNTER — Other Ambulatory Visit: Payer: Self-pay

## 2022-07-05 ENCOUNTER — Other Ambulatory Visit: Payer: Self-pay | Admitting: Adult Health

## 2022-07-05 ENCOUNTER — Ambulatory Visit: Payer: 59 | Admitting: Adult Health

## 2022-07-05 ENCOUNTER — Other Ambulatory Visit (HOSPITAL_BASED_OUTPATIENT_CLINIC_OR_DEPARTMENT_OTHER): Payer: Self-pay

## 2022-07-05 DIAGNOSIS — F411 Generalized anxiety disorder: Secondary | ICD-10-CM

## 2022-07-05 MED ORDER — ALPRAZOLAM 0.5 MG PO TABS
0.5000 mg | ORAL_TABLET | Freq: Two times a day (BID) | ORAL | 0 refills | Status: DC | PRN
  Filled 2022-07-05: qty 60, 30d supply, fill #0

## 2022-07-06 ENCOUNTER — Ambulatory Visit: Payer: 59 | Admitting: Family Medicine

## 2022-07-07 ENCOUNTER — Telehealth: Payer: Self-pay | Admitting: Adult Health

## 2022-07-07 ENCOUNTER — Other Ambulatory Visit (HOSPITAL_BASED_OUTPATIENT_CLINIC_OR_DEPARTMENT_OTHER): Payer: Self-pay

## 2022-07-07 NOTE — Telephone Encounter (Signed)
RF was sent on 1/3. When Rx is sent to a [facility] pharmacy it does not show the usual approval notation. LVM for patient.

## 2022-07-07 NOTE — Telephone Encounter (Signed)
Margaret Alvarado called at 11:00 to request refill of her Xanax.  Appt 07/20/22.  Send to [facility].  Looks like one was going to be sent 07/05/22 but it did not get submitted.

## 2022-07-11 ENCOUNTER — Other Ambulatory Visit: Payer: Self-pay | Admitting: Adult Health

## 2022-07-11 ENCOUNTER — Other Ambulatory Visit (HOSPITAL_BASED_OUTPATIENT_CLINIC_OR_DEPARTMENT_OTHER): Payer: Self-pay

## 2022-07-11 DIAGNOSIS — F331 Major depressive disorder, recurrent, moderate: Secondary | ICD-10-CM

## 2022-07-11 DIAGNOSIS — F909 Attention-deficit hyperactivity disorder, unspecified type: Secondary | ICD-10-CM

## 2022-07-11 MED ORDER — BUPROPION HCL ER (XL) 150 MG PO TB24
150.0000 mg | ORAL_TABLET | Freq: Every morning | ORAL | 0 refills | Status: DC
  Filled 2022-07-11: qty 30, 30d supply, fill #0

## 2022-07-14 ENCOUNTER — Other Ambulatory Visit (HOSPITAL_BASED_OUTPATIENT_CLINIC_OR_DEPARTMENT_OTHER): Payer: Self-pay

## 2022-07-14 MED ORDER — METHYLPHENIDATE HCL 20 MG PO TABS
20.0000 mg | ORAL_TABLET | Freq: Two times a day (BID) | ORAL | 0 refills | Status: DC
  Filled 2022-07-14: qty 12, 6d supply, fill #0

## 2022-07-14 NOTE — Telephone Encounter (Signed)
Pt lvm stating that she needs this sent in before the weekend because the pharmacy is closed

## 2022-07-20 ENCOUNTER — Ambulatory Visit (INDEPENDENT_AMBULATORY_CARE_PROVIDER_SITE_OTHER): Payer: 59 | Admitting: Adult Health

## 2022-07-20 DIAGNOSIS — F489 Nonpsychotic mental disorder, unspecified: Secondary | ICD-10-CM

## 2022-07-20 NOTE — Progress Notes (Signed)
Patient no show appointment. ? ?

## 2022-07-21 ENCOUNTER — Other Ambulatory Visit: Payer: Self-pay | Admitting: Adult Health

## 2022-07-21 ENCOUNTER — Other Ambulatory Visit (HOSPITAL_BASED_OUTPATIENT_CLINIC_OR_DEPARTMENT_OTHER): Payer: Self-pay

## 2022-07-21 DIAGNOSIS — F909 Attention-deficit hyperactivity disorder, unspecified type: Secondary | ICD-10-CM

## 2022-07-21 NOTE — Telephone Encounter (Signed)
Please review filled 07/14/22 for a week supply

## 2022-07-21 NOTE — Telephone Encounter (Signed)
Patient called in for refill on Ritalin 20mg  She is no longer a patient of [facility] but was told that she would we able to get prescriptions for three months until [name] could get an appt with someone else. Ph: [address] [place]

## 2022-07-21 NOTE — Telephone Encounter (Signed)
Please schedule appt

## 2022-07-24 ENCOUNTER — Other Ambulatory Visit (HOSPITAL_BASED_OUTPATIENT_CLINIC_OR_DEPARTMENT_OTHER): Payer: Self-pay

## 2022-07-24 MED ORDER — METHYLPHENIDATE HCL 20 MG PO TABS
20.0000 mg | ORAL_TABLET | Freq: Two times a day (BID) | ORAL | 0 refills | Status: DC
  Filled 2022-07-24 (×2): qty 60, 30d supply, fill #0

## 2022-07-24 NOTE — Telephone Encounter (Signed)
Go ahead and pend a 4 week supply.

## 2022-07-31 ENCOUNTER — Other Ambulatory Visit: Payer: Self-pay | Admitting: Adult Health

## 2022-07-31 ENCOUNTER — Other Ambulatory Visit (HOSPITAL_BASED_OUTPATIENT_CLINIC_OR_DEPARTMENT_OTHER): Payer: Self-pay

## 2022-07-31 MED ORDER — SERTRALINE HCL 100 MG PO TABS
100.0000 mg | ORAL_TABLET | Freq: Every day | ORAL | 2 refills | Status: DC
  Filled 2022-07-31: qty 30, 30d supply, fill #0
  Filled 2022-09-03 – 2022-09-18 (×2): qty 30, 30d supply, fill #1

## 2022-08-06 ENCOUNTER — Other Ambulatory Visit: Payer: Self-pay | Admitting: Adult Health

## 2022-08-06 DIAGNOSIS — F411 Generalized anxiety disorder: Secondary | ICD-10-CM

## 2022-08-07 NOTE — Telephone Encounter (Signed)
Actually we need to offer her a taper off the medication since she is taking 1mg  a day.

## 2022-08-07 NOTE — Telephone Encounter (Signed)
Please advise weaning schedule an I'll pend.

## 2022-08-07 NOTE — Telephone Encounter (Signed)
Please advise weaning schedule and I'll pend.

## 2022-08-08 ENCOUNTER — Other Ambulatory Visit (HOSPITAL_BASED_OUTPATIENT_CLINIC_OR_DEPARTMENT_OTHER): Payer: Self-pay

## 2022-08-08 MED ORDER — ALPRAZOLAM 0.5 MG PO TABS
0.5000 mg | ORAL_TABLET | Freq: Two times a day (BID) | ORAL | 1 refills | Status: DC | PRN
  Filled 2022-08-08: qty 60, 30d supply, fill #0
  Filled 2022-09-03 – 2022-09-18 (×2): qty 60, 30d supply, fill #1

## 2022-08-08 NOTE — Telephone Encounter (Signed)
Actually, we can do a 30 day this month, and do a taper next month unless she finds a provider to continue medication.

## 2022-08-14 ENCOUNTER — Telehealth: Payer: No Typology Code available for payment source | Admitting: Family

## 2022-08-14 DIAGNOSIS — L0291 Cutaneous abscess, unspecified: Secondary | ICD-10-CM

## 2022-08-14 MED ORDER — SULFAMETHOXAZOLE-TRIMETHOPRIM 800-160 MG PO TABS: 1.0000 | ORAL_TABLET | Freq: Two times a day (BID) | ORAL | 0 refills | Status: DC

## 2022-08-14 NOTE — Progress Notes (Signed)
E Visit for Cellulitis  We are sorry that you are not feeling well. Here is how we plan to help!  Based on what you shared with me it looks like you have cellulitis.  Cellulitis looks like areas of skin redness, swelling, and warmth; it develops as a result of bacteria entering under the skin. Little red spots and/or bleeding can be seen in skin, and tiny surface sacs containing fluid can occur. Fever can be present. Cellulitis is almost always on one side of a body, and the lower limbs are the most common site of involvement.   I have prescribed:  Bactrim DS 1 tablet by mouth twice a day for 7 days  HOME CARE:  Take your medications as ordered and take all of them, even if the skin irritation appears to be healing.   GET HELP RIGHT AWAY IF:  Symptoms that don't begin to go away within 48 hours. Severe redness persists or worsens If the area turns color, spreads or swells. If it blisters and opens, develops yellow-brown crust or bleeds. You develop a fever or chills. If the pain increases or becomes unbearable.  Are unable to keep fluids and food down.  MAKE SURE YOU   Understand these instructions. Will watch your condition. Will get help right away if you are not doing well or get worse.  Thank you for choosing an e-visit.  Your e-visit answers were reviewed by a board certified advanced clinical practitioner to complete your personal care plan. Depending upon the condition, your plan could have included both over the counter or prescription medications.  Please review your pharmacy choice. Make sure the pharmacy is open so you can pick up prescription now. If there is a problem, you may contact your provider through [employer] and have the prescription routed to another pharmacy.  Your safety is important to [place]. If you have drug allergies check your prescription carefully.   For the next 24 hours you can use MyChart to ask questions about today's visit, request a  non-urgent call back, or ask for a work or school excuse. You will get an email in the next two days asking about your experience. I hope that your e-visit has been valuable and will speed your recovery.  Approximately 5 minutes was spent documenting and reviewing patient's chart.

## 2022-08-15 ENCOUNTER — Emergency Department (HOSPITAL_BASED_OUTPATIENT_CLINIC_OR_DEPARTMENT_OTHER)
Admission: EM | Admit: 2022-08-15 | Discharge: 2022-08-15 | Disposition: A | Discharge status: Home / Self Care | Payer: No Typology Code available for payment source | Attending: Emergency Medicine | Admitting: Emergency Medicine

## 2022-08-15 ENCOUNTER — Other Ambulatory Visit: Payer: Self-pay

## 2022-08-15 ENCOUNTER — Other Ambulatory Visit (HOSPITAL_BASED_OUTPATIENT_CLINIC_OR_DEPARTMENT_OTHER): Payer: Self-pay

## 2022-08-15 DIAGNOSIS — N751 Abscess of Bartholin's gland: Secondary | ICD-10-CM | POA: Diagnosis not present

## 2022-08-15 DIAGNOSIS — N750 Cyst of Bartholin's gland: Secondary | ICD-10-CM | POA: Diagnosis not present

## 2022-08-15 MED ORDER — OXYCODONE-ACETAMINOPHEN 5-325 MG PO TABS
1.0000 | ORAL_TABLET | Freq: Once | ORAL | Status: AC
  Administered 2022-08-15: 1 via ORAL
  Filled 2022-08-15: qty 1

## 2022-08-15 MED ORDER — LIDOCAINE-PRILOCAINE 2.5-2.5 % EX CREA: TOPICAL_CREAM | Freq: Once | CUTANEOUS | Status: DC

## 2022-08-15 MED ORDER — LIDOCAINE HCL (PF) 1 % IJ SOLN
10.0000 mL | Freq: Once | INTRAMUSCULAR | Status: AC
  Administered 2022-08-15: 10 mL
  Filled 2022-08-15: qty 10

## 2022-08-15 MED ORDER — OXYCODONE-ACETAMINOPHEN 5-325 MG PO TABS
1.0000 | ORAL_TABLET | Freq: Four times a day (QID) | ORAL | 0 refills | Status: DC | PRN
  Filled 2022-08-15: qty 15, 4d supply, fill #0

## 2022-08-15 MED ORDER — FENTANYL CITRATE PF 50 MCG/ML IJ SOSY
100.0000 ug | PREFILLED_SYRINGE | Freq: Once | INTRAMUSCULAR | Status: AC
  Administered 2022-08-15: 100 ug via INTRAVENOUS
  Filled 2022-08-15: qty 2

## 2022-08-15 MED ORDER — LORAZEPAM 2 MG/ML IJ SOLN
1.0000 mg | Freq: Once | INTRAMUSCULAR | Status: AC
  Administered 2022-08-15: 1 mg via INTRAVENOUS
  Filled 2022-08-15: qty 1

## 2022-08-15 MED ORDER — OXYCODONE-ACETAMINOPHEN 5-325 MG PO TABS: 1.0000 | ORAL_TABLET | Freq: Four times a day (QID) | ORAL | 0 refills | Status: DC | PRN

## 2022-08-15 NOTE — Discharge Instructions (Addendum)
You were seen in the emergency department for an abscess.  We have drained the area and cleaned it. I would like you to have the wound checked in 2-3 days. This can be done by any doctor's office, urgent care, or emergency department. This is to make sure the area hasn't closed too soon and to have your catheter and suture removed. Try to keep the area as clean and dry as possible. It is okay to let warm soapy water run over the area, but do NOT scrub the area.   Please continue to take the Bactrim that was prescribed to you previously.  It is important you finish the entire course! You can take ibuprofen or tylenol as needed for pain.  I have also given you a prescription for Percocet which is a narcotic pain medication for you to take as prescribed as needed.  Do not drive or operate heavy machinery while taking this medication.  Continue to monitor how you're doing and return to the ER for new or worsening symptoms.

## 2022-08-15 NOTE — ED Provider Notes (Signed)
[facility] [place] Provider Note   CSN: [id number] Arrival date & time: 08/15/22  1401     History  Chief Complaint  Patient presents with   Abscess   Bartholin's Cyst    Margaret Alvarado is a 37 y.o. female.  Patient with no pertinent past medical history presents today with complaints of labial abscess.  She states that she first noticed same on Saturday and it has been persistent since.  She endorses history of Bartholin cyst previously, however states that they normally drained on their own without intervention.  States that 2 days ago she did a televisit with her primary care doctor who placed her on Bactrim which she has been taking as prescribed without improvement of symptoms.  The area has not been draining.  She has also been doing Epsom salt baths and warm soaks.  She denies fevers or chills.  No nausea, vomiting, or diarrhea.  The history is provided by the patient. No language interpreter was used.  Abscess      Home Medications Prior to Admission medications   Medication Sig Start Date End Date Taking? Authorizing Provider  albuterol (VENTOLIN HFA) 108 (90 Base) MCG/ACT inhaler Inhale 2 puffs into the lungs every 6 (six) hours as needed for wheezing or shortness of breath. 03/02/22   Brunetta Jeans, PA-C  ALPRAZolam Duanne Moron) 0.5 MG tablet Take 1 tablet (0.5 mg total) by mouth 2 (two) times daily as needed for anxiety 08/08/22   Mozingo, Berdie Ogren, NP  benzonatate (TESSALON) 100 MG capsule Take 1 capsule (100 mg total) by mouth 3 (three) times daily as needed. 06/12/22   Mar Daring, PA-C  Buprenorphine HCl-Naloxone HCl 4-1 MG FILM Dissolve 1 film under the tongue 2 times daily. **Max daily dose: 2 films** 11/29/21     buPROPion (WELLBUTRIN XL) 150 MG 24 hr tablet Take 1 tablet (150 mg total) by mouth every morning. 07/11/22   Mozingo, Berdie Ogren, NP  cephALEXin (KEFLEX) 500 MG capsule Take 1 capsule by mouth 2  times daily 10/05/21   Hassell Done, Mary-Margaret, FNP  fluticasone (FLONASE) 50 MCG/ACT nasal spray Place 2 sprays into both nostrils daily. 06/12/22   Mar Daring, PA-C  methylphenidate (RITALIN) 20 MG tablet Take 1 tablet (20 mg total) by mouth 2 (two) times daily. 07/24/22   Mozingo, Berdie Ogren, NP  mupirocin ointment (BACTROBAN) 2 % Apply 1 Application topically 2 (two) times daily. 03/19/22   Gildardo Pounds, NP  promethazine-dextromethorphan (PROMETHAZINE-DM) 6.25-15 MG/5ML syrup Take 5 mLs by mouth 4 (four) times daily as needed. 06/12/22   Mar Daring, PA-C  sertraline (ZOLOFT) 100 MG tablet Take 1 tablet (100 mg total) by mouth daily. 03/21/22   Mozingo, Berdie Ogren, NP  sertraline (ZOLOFT) 100 MG tablet Take 1 tablet (100 mg total) by mouth daily. 07/31/22   Mozingo, Berdie Ogren, NP  sulfamethoxazole-trimethoprim (BACTRIM DS) 800-160 MG tablet Take 1 tablet by mouth 2 (two) times daily. 08/14/22   Sharion Balloon, FNP      Allergies    Patient has no known allergies.    Review of Systems   Review of Systems  Skin:  Positive for wound.  All other systems reviewed and are negative.   Physical Exam Updated Vital Signs BP 114/74   Pulse 96   Temp 98.3 F (36.8 C)   Resp 18   Ht 5' 7"$  (1.702 m)   LMP 07/18/2022 (Approximate)   SpO2 100%  BMI 23.96 kg/m  Physical Exam Vitals and nursing note reviewed. Exam conducted with a chaperone present.  Constitutional:      General: She is not in acute distress.    Appearance: Normal appearance. She is normal weight. She is not ill-appearing, toxic-appearing or diaphoretic.  HENT:     Head: Normocephalic and atraumatic.  Cardiovascular:     Rate and Rhythm: Normal rate.  Pulmonary:     Effort: Pulmonary effort is normal. No respiratory distress.  Abdominal:     General: Abdomen is flat.     Palpations: Abdomen is soft.     Tenderness: There is no abdominal tenderness.  Genitourinary:    Comments: Left  side of the labia with fluctuance and surrounding induration consistent with bartholin's abscess. No active drainage. No crepitus or diffuse spread of infection Musculoskeletal:        General: Normal range of motion.     Cervical back: Normal range of motion.  Skin:    General: Skin is warm and dry.  Neurological:     General: No focal deficit present.     Mental Status: She is alert.  Psychiatric:        Mood and Affect: Mood normal.        Behavior: Behavior normal.     ED Results / Procedures / Treatments   Labs (all labs ordered are listed, but only abnormal results are displayed) Labs Reviewed - No data to display  EKG None  Radiology No results found.  Procedures .Marland KitchenIncision and Drainage  Date/Time: 08/15/2022 7:18 PM  Performed by: Bud Face, PA-C Authorized by: Bud Face, PA-C   Consent:    Consent obtained:  Verbal   Consent given by:  Patient   Risks, benefits, and alternatives were discussed: yes     Risks discussed:  Bleeding, incomplete drainage, pain, damage to other organs and infection   Alternatives discussed:  No treatment, delayed treatment, alternative treatment, observation and referral Universal protocol:    Procedure explained and questions answered to patient or proxy's satisfaction: yes     Patient identity confirmed:  Verbally with patient Location:    Type:  Bartholin cyst   Size:  2 cm x 2 cm   Location:  Anogenital   Anogenital location:  Bartholin's gland Pre-procedure details:    Skin preparation:  Povidone-iodine Sedation:    Sedation type:  Anxiolysis Anesthesia:    Anesthesia method:  Local infiltration   Local anesthetic:  Lidocaine 1% w/o epi Procedure type:    Complexity:  Simple Procedure details:    Incision types:  Stab incision   Wound management:  Probed and deloculated   Drainage:  Bloody and purulent   Drainage amount:  Moderate   Wound treatment:  Drain placed   Packing materials:  Word catheter (word  catheter with 1 3-0 silk suture) Post-procedure details:    Procedure completion:  Tolerated well, no immediate complications     Medications Ordered in ED Medications  oxyCODONE-acetaminophen (PERCOCET/ROXICET) 5-325 MG per tablet 1 tablet (has no administration in time range)  lidocaine (PF) (XYLOCAINE) 1 % injection 10 mL (10 mLs Infiltration Given by Other 08/15/22 1725)  LORazepam (ATIVAN) injection 1 mg (1 mg Intravenous Given 08/15/22 1738)  fentaNYL (SUBLIMAZE) injection 100 mcg (100 mcg Intravenous Given 08/15/22 1738)    ED Course/ Medical Decision Making/ A&P  Medical Decision Making Risk Prescription drug management.   Patient presents today with complaints of left labial abscess consistent with bartholin's cyst. She is afebrile, non-toxic appearing, and in no acute distress with reassuring vital signs. Abscess amenable to incision and drainage per above procedure and packed with a word catheter and 1 suture. Plan for wound recheck and catheter/suture removal in 2 days.  Encouraged home warm soaks.  Mild signs of cellulitis is surrounding skin.  Patient is already on Bactrim given by her primary care doctor 3 days ago.  I have emphasized the importance of completing this course of antibiotics. Will give percocet for pain. PDMP reviewed. Patient aware not to drive or operate heavy machinery while taking this medication.   Disposition: Patient is not requiring admission or inpatient treatment further symptoms.  Patient was placed on course of antibiotics and instructed to have a wound check in 3 days.  We discussed reasons to return to the emergency department, and patient is agreeable to the plan.  Patient discharged in stable condition.  Final Clinical Impression(s) / ED Diagnoses Final diagnoses:  Bartholin's cyst    Rx / DC Orders ED Discharge Orders          Ordered    oxyCODONE-acetaminophen (PERCOCET/ROXICET) 5-325 MG tablet  Every 6  hours PRN        08/15/22 1930          An After Visit Summary was printed and given to the patient.     Nestor Lewandowsky 08/15/22 1932    Fredia Sorrow, MD 08/17/22 [phone]

## 2022-08-15 NOTE — ED Notes (Signed)
ED Provider at bedside with I & D tray

## 2022-08-15 NOTE — ED Triage Notes (Signed)
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 Pt reports pain in right groin to buttocks.

## 2022-08-16 ENCOUNTER — Other Ambulatory Visit (HOSPITAL_BASED_OUTPATIENT_CLINIC_OR_DEPARTMENT_OTHER): Payer: Self-pay

## 2022-08-19 ENCOUNTER — Other Ambulatory Visit: Payer: Self-pay | Admitting: Adult Health

## 2022-08-19 DIAGNOSIS — F331 Major depressive disorder, recurrent, moderate: Secondary | ICD-10-CM

## 2022-08-19 DIAGNOSIS — F909 Attention-deficit hyperactivity disorder, unspecified type: Secondary | ICD-10-CM

## 2022-08-19 DIAGNOSIS — N750 Cyst of Bartholin's gland: Secondary | ICD-10-CM | POA: Diagnosis not present

## 2022-08-20 MED ORDER — BUPROPION HCL ER (XL) 150 MG PO TB24
150.0000 mg | ORAL_TABLET | Freq: Every morning | ORAL | 1 refills | Status: DC
  Filled 2022-08-20: qty 30, 30d supply, fill #0

## 2022-08-20 NOTE — Telephone Encounter (Signed)
Due 2/20

## 2022-08-21 ENCOUNTER — Other Ambulatory Visit (HOSPITAL_BASED_OUTPATIENT_CLINIC_OR_DEPARTMENT_OTHER): Payer: Self-pay

## 2022-08-21 ENCOUNTER — Telehealth: Payer: Self-pay | Admitting: Adult Health

## 2022-08-21 ENCOUNTER — Other Ambulatory Visit: Payer: Self-pay

## 2022-08-21 DIAGNOSIS — F909 Attention-deficit hyperactivity disorder, unspecified type: Secondary | ICD-10-CM

## 2022-08-21 NOTE — Telephone Encounter (Signed)
Due 2/20.

## 2022-08-21 NOTE — Telephone Encounter (Signed)
Pended.

## 2022-08-21 NOTE — Telephone Encounter (Signed)
Pt called and needs a refill on her ritalin 20 mg . Pharmacy is [facility]. They do have it in stock

## 2022-08-22 ENCOUNTER — Other Ambulatory Visit (HOSPITAL_BASED_OUTPATIENT_CLINIC_OR_DEPARTMENT_OTHER): Payer: Self-pay

## 2022-08-22 NOTE — Telephone Encounter (Signed)
See me about this.

## 2022-08-23 ENCOUNTER — Encounter: Payer: No Typology Code available for payment source | Admitting: Radiology

## 2022-08-24 ENCOUNTER — Other Ambulatory Visit (HOSPITAL_BASED_OUTPATIENT_CLINIC_OR_DEPARTMENT_OTHER): Payer: Self-pay

## 2022-08-24 ENCOUNTER — Ambulatory Visit: Payer: No Typology Code available for payment source | Admitting: Nurse Practitioner

## 2022-08-24 MED ORDER — METHYLPHENIDATE HCL 20 MG PO TABS
20.0000 mg | ORAL_TABLET | Freq: Two times a day (BID) | ORAL | 0 refills | Status: DC
  Filled 2022-08-24: qty 60, 30d supply, fill #0

## 2022-08-24 NOTE — Telephone Encounter (Signed)
We need to also do a taper off the Xanax for the last refill of Xanax. No taper needed for Ritalin.

## 2022-09-04 ENCOUNTER — Telehealth: Payer: Self-pay | Admitting: Nurse Practitioner

## 2022-09-04 ENCOUNTER — Ambulatory Visit: Payer: No Typology Code available for payment source | Admitting: Nurse Practitioner

## 2022-09-04 ENCOUNTER — Other Ambulatory Visit (HOSPITAL_BASED_OUTPATIENT_CLINIC_OR_DEPARTMENT_OTHER): Payer: Self-pay

## 2022-09-04 NOTE — Telephone Encounter (Signed)
3.4.24 no show letter sent

## 2022-09-12 NOTE — Telephone Encounter (Signed)
Previous cancellation with [facility]. No show new pt visit with [facility]. Unable to reschedule at [facility].

## 2022-09-15 ENCOUNTER — Other Ambulatory Visit (HOSPITAL_BASED_OUTPATIENT_CLINIC_OR_DEPARTMENT_OTHER): Payer: Self-pay

## 2022-09-18 ENCOUNTER — Telehealth: Payer: Self-pay | Admitting: Adult Health

## 2022-09-18 ENCOUNTER — Other Ambulatory Visit: Payer: Self-pay

## 2022-09-18 DIAGNOSIS — F909 Attention-deficit hyperactivity disorder, unspecified type: Secondary | ICD-10-CM

## 2022-09-18 NOTE — Telephone Encounter (Signed)
Margaret Alvarado called to request refill of her Ritalin and Xanax.  She doesn't have an appt because she said she is switch drs.  Has appt ~4/17 with new provider.  She said you were prescribing her medications until her new appt.  Send to [place].

## 2022-09-18 NOTE — Telephone Encounter (Signed)
Ritalin due 3/21 and will be the last fill. She has a RF available for alprazolam and will have no further RF.

## 2022-09-18 NOTE — Telephone Encounter (Signed)
Would you pls pend the amounts for these?

## 2022-09-21 ENCOUNTER — Other Ambulatory Visit: Payer: Self-pay

## 2022-09-21 ENCOUNTER — Other Ambulatory Visit (HOSPITAL_BASED_OUTPATIENT_CLINIC_OR_DEPARTMENT_OTHER): Payer: Self-pay

## 2022-09-21 DIAGNOSIS — F909 Attention-deficit hyperactivity disorder, unspecified type: Secondary | ICD-10-CM

## 2022-09-21 MED ORDER — METHYLPHENIDATE HCL 20 MG PO TABS
20.0000 mg | ORAL_TABLET | Freq: Two times a day (BID) | ORAL | 0 refills | Status: DC
  Filled 2022-09-21: qty 60, 30d supply, fill #0

## 2022-09-21 NOTE — Telephone Encounter (Signed)
Pended.

## 2022-10-20 ENCOUNTER — Other Ambulatory Visit (HOSPITAL_BASED_OUTPATIENT_CLINIC_OR_DEPARTMENT_OTHER): Payer: Self-pay

## 2022-10-20 DIAGNOSIS — F331 Major depressive disorder, recurrent, moderate: Secondary | ICD-10-CM | POA: Diagnosis not present

## 2022-10-20 DIAGNOSIS — F411 Generalized anxiety disorder: Secondary | ICD-10-CM | POA: Diagnosis not present

## 2022-10-20 DIAGNOSIS — F909 Attention-deficit hyperactivity disorder, unspecified type: Secondary | ICD-10-CM | POA: Diagnosis not present

## 2022-10-20 MED ORDER — SERTRALINE HCL 100 MG PO TABS
100.0000 mg | ORAL_TABLET | Freq: Every day | ORAL | 2 refills | Status: DC
  Filled 2022-10-20: qty 30, 30d supply, fill #0

## 2022-10-20 MED ORDER — METHYLPHENIDATE HCL 20 MG PO TABS
20.0000 mg | ORAL_TABLET | Freq: Two times a day (BID) | ORAL | 0 refills | Status: DC
  Filled 2022-12-23: qty 60, 30d supply, fill #0

## 2022-10-20 MED ORDER — METHYLPHENIDATE HCL 20 MG PO TABS
20.0000 mg | ORAL_TABLET | Freq: Two times a day (BID) | ORAL | 0 refills | Status: DC
  Filled 2022-11-20: qty 60, 30d supply, fill #0

## 2022-10-20 MED ORDER — METHYLPHENIDATE HCL 20 MG PO TABS
20.0000 mg | ORAL_TABLET | Freq: Two times a day (BID) | ORAL | 0 refills | Status: DC
  Filled 2022-10-20: qty 60, 30d supply, fill #0

## 2022-10-20 MED ORDER — ALPRAZOLAM 0.5 MG PO TABS
0.5000 mg | ORAL_TABLET | Freq: Two times a day (BID) | ORAL | 2 refills | Status: DC | PRN
  Filled 2022-10-20: qty 60, 30d supply, fill #0
  Filled 2022-11-20: qty 60, 30d supply, fill #1
  Filled 2022-12-23: qty 60, 30d supply, fill #2

## 2022-11-03 DIAGNOSIS — F331 Major depressive disorder, recurrent, moderate: Secondary | ICD-10-CM | POA: Diagnosis not present

## 2022-11-03 DIAGNOSIS — F909 Attention-deficit hyperactivity disorder, unspecified type: Secondary | ICD-10-CM | POA: Diagnosis not present

## 2022-11-03 DIAGNOSIS — F411 Generalized anxiety disorder: Secondary | ICD-10-CM | POA: Diagnosis not present

## 2022-11-15 DIAGNOSIS — F1120 Opioid dependence, uncomplicated: Secondary | ICD-10-CM | POA: Diagnosis not present

## 2022-11-15 DIAGNOSIS — F411 Generalized anxiety disorder: Secondary | ICD-10-CM | POA: Diagnosis not present

## 2022-11-15 DIAGNOSIS — F1510 Other stimulant abuse, uncomplicated: Secondary | ICD-10-CM | POA: Diagnosis not present

## 2022-11-15 DIAGNOSIS — F1110 Opioid abuse, uncomplicated: Secondary | ICD-10-CM | POA: Diagnosis not present

## 2022-11-15 DIAGNOSIS — F1491 Cocaine use, unspecified, in remission: Secondary | ICD-10-CM | POA: Diagnosis not present

## 2022-11-15 DIAGNOSIS — F4310 Post-traumatic stress disorder, unspecified: Secondary | ICD-10-CM | POA: Diagnosis not present

## 2022-11-15 DIAGNOSIS — F17200 Nicotine dependence, unspecified, uncomplicated: Secondary | ICD-10-CM | POA: Diagnosis not present

## 2022-11-15 DIAGNOSIS — F1011 Alcohol abuse, in remission: Secondary | ICD-10-CM | POA: Diagnosis not present

## 2022-11-20 ENCOUNTER — Other Ambulatory Visit: Payer: Self-pay

## 2022-11-20 ENCOUNTER — Other Ambulatory Visit (HOSPITAL_BASED_OUTPATIENT_CLINIC_OR_DEPARTMENT_OTHER): Payer: Self-pay

## 2022-11-28 DIAGNOSIS — F1491 Cocaine use, unspecified, in remission: Secondary | ICD-10-CM | POA: Diagnosis not present

## 2022-11-28 DIAGNOSIS — F1120 Opioid dependence, uncomplicated: Secondary | ICD-10-CM | POA: Diagnosis not present

## 2022-11-28 DIAGNOSIS — F4310 Post-traumatic stress disorder, unspecified: Secondary | ICD-10-CM | POA: Diagnosis not present

## 2022-11-28 DIAGNOSIS — F411 Generalized anxiety disorder: Secondary | ICD-10-CM | POA: Diagnosis not present

## 2022-11-28 DIAGNOSIS — F17200 Nicotine dependence, unspecified, uncomplicated: Secondary | ICD-10-CM | POA: Diagnosis not present

## 2022-11-28 DIAGNOSIS — F1310 Sedative, hypnotic or anxiolytic abuse, uncomplicated: Secondary | ICD-10-CM | POA: Diagnosis not present

## 2022-11-28 DIAGNOSIS — F1011 Alcohol abuse, in remission: Secondary | ICD-10-CM | POA: Diagnosis not present

## 2022-11-28 DIAGNOSIS — F1510 Other stimulant abuse, uncomplicated: Secondary | ICD-10-CM | POA: Diagnosis not present

## 2022-11-29 DIAGNOSIS — F1120 Opioid dependence, uncomplicated: Secondary | ICD-10-CM | POA: Diagnosis not present

## 2022-11-29 DIAGNOSIS — F1110 Opioid abuse, uncomplicated: Secondary | ICD-10-CM | POA: Diagnosis not present

## 2022-12-04 ENCOUNTER — Ambulatory Visit: Payer: No Typology Code available for payment source

## 2022-12-05 DIAGNOSIS — F331 Major depressive disorder, recurrent, moderate: Secondary | ICD-10-CM | POA: Diagnosis not present

## 2022-12-05 DIAGNOSIS — F909 Attention-deficit hyperactivity disorder, unspecified type: Secondary | ICD-10-CM | POA: Diagnosis not present

## 2022-12-05 DIAGNOSIS — F411 Generalized anxiety disorder: Secondary | ICD-10-CM | POA: Diagnosis not present

## 2022-12-19 DIAGNOSIS — F1110 Opioid abuse, uncomplicated: Secondary | ICD-10-CM | POA: Diagnosis not present

## 2022-12-19 DIAGNOSIS — F1491 Cocaine use, unspecified, in remission: Secondary | ICD-10-CM | POA: Diagnosis not present

## 2022-12-19 DIAGNOSIS — F1011 Alcohol abuse, in remission: Secondary | ICD-10-CM | POA: Diagnosis not present

## 2022-12-19 DIAGNOSIS — F1310 Sedative, hypnotic or anxiolytic abuse, uncomplicated: Secondary | ICD-10-CM | POA: Diagnosis not present

## 2022-12-19 DIAGNOSIS — F1120 Opioid dependence, uncomplicated: Secondary | ICD-10-CM | POA: Diagnosis not present

## 2022-12-19 DIAGNOSIS — F411 Generalized anxiety disorder: Secondary | ICD-10-CM | POA: Diagnosis not present

## 2022-12-19 DIAGNOSIS — F4310 Post-traumatic stress disorder, unspecified: Secondary | ICD-10-CM | POA: Diagnosis not present

## 2022-12-19 DIAGNOSIS — F17200 Nicotine dependence, unspecified, uncomplicated: Secondary | ICD-10-CM | POA: Diagnosis not present

## 2022-12-19 DIAGNOSIS — F1510 Other stimulant abuse, uncomplicated: Secondary | ICD-10-CM | POA: Diagnosis not present

## 2022-12-21 ENCOUNTER — Ambulatory Visit (HOSPITAL_COMMUNITY)
Admission: EM | Admit: 2022-12-21 | Discharge: 2022-12-21 | Disposition: A | Discharge status: Home / Self Care | Payer: No Typology Code available for payment source

## 2022-12-21 DIAGNOSIS — F1510 Other stimulant abuse, uncomplicated: Secondary | ICD-10-CM

## 2022-12-21 NOTE — Progress Notes (Signed)
   12/21/22 1343  [facility] Triage Screening (Walk-ins at [facility] only)  How Did You Hear About [place]? Self  What Is the Reason for Your Visit/Call Today? Pt presents to [facility] voluntarily seeking substance use treatment. Pt is interested in chemical dependency intensive outpatient program for meth use. Pt reports her last use of meth was 2 days ago.Pt denies SI/HI and AVH.  How Long Has This Been Causing You Problems? <Week  Have You Recently Had Any Thoughts About Hurting Yourself? No  Are You Planning to Commit Suicide/Harm Yourself At This time? No  Have you Recently Had Thoughts About Hurting Someone Karolee Ohs? No  Are You Planning To Harm Someone At This Time? No  Are you currently experiencing any auditory, visual or other hallucinations? No  Have You Used Any Alcohol or Drugs in the Past 24 Hours? No  Do you have any current medical co-morbidities that require immediate attention? No  Clinician description of patient physical appearance/behavior: calm ,cooperative, casually dressed  What Do You Feel Would Help You the Most Today? Alcohol or Drug Use Treatment  If access to [facility] Urgent Care was not available, would you have sought care in the Emergency Department? No  Determination of Need Routine (7 days)  Options For Referral Chemical Dependency Intensive Outpatient Therapy (CDIOP)

## 2022-12-21 NOTE — Discharge Instructions (Addendum)
Discharge recommendations:   Medications: Patient is to take medications as prescribed. No medication changes were made during your visit today. The patient or patient's guardian is to contact a medical professional and/or outpatient provider to address any new side effects that develop. The patient or the patient's guardian should update outpatient providers of any new medications and/or medication changes.   Therapy: We recommend that patient participate in individual therapy to address mental health concerns and substance abuse.  Safety:   The following safety precautions should be taken:   No sharp objects. This includes scissors, razors, scrapers, and putty knives.   Chemicals should be removed and locked up.   Medications should be removed and locked up.   Weapons should be removed and locked up. This includes firearms, knives and instruments that can be used to cause injury.   The patient should abstain from use of illicit substances/drugs and abuse of any medications.  If symptoms worsen or do not continue to improve or if the patient becomes actively suicidal or homicidal then it is recommended that the patient return to the closest hospital emergency department, the [facility], or call 911 for further evaluation and treatment. National Suicide Prevention Lifeline 1-800-SUICIDE or 731 517 7542.  About 988 988 offers 24/7 access to trained crisis counselors who can help people experiencing mental health-related distress. People can call or text 988 or chat 988lifeline.org for themselves or if they are worried about a loved one who may need crisis support.   Base on the information you have provided and the presenting issue, outpatient services and resources for have been recommended.  It is imperative that you follow through with treatment recommendations within 5-7 days from the of discharge to mitigate further risk to your safety and mental  well-being. A list of referrals has been provided below to get you started.  You are not limited to the list provided.  In case of an urgent crisis, you may contact the Mobile Crisis Unit with [facility] at 1.270-105-4968.                      [facility]   Specialized Group Therapy for Substance Abuse If you have a substance abuse disorder (with or without a mental health condition), you may benefit from specialized substance abuse therapy in a group setting. According to discharge survey data, 70 percent of patients completing our chemical dependency intensive outpatient program report fewer symptoms of substance abuse and incidents of relapse.  Under the guidance of a licensed mental health professional, meet with your peers every Monday, Wednesday and Friday from 9 a.m. to 12 p.m. for 6 to 8 weeks to:  Learn about chemical dependency, mental illness and co-occurring disorders. Develop relapse-prevention skills. Set personalized goals with your treatment team. To build on the skills you gain, you can attend Alcoholics Anonymous or Narcotics Anonymous meetings in the evenings and access follow-up care through weekly group meetings with peers. Ongoing support promotes wellness and recovery.  For more information, call [name], LCSW at [phone]. We work directly with employers and families to ensure you receive the care you need.

## 2022-12-21 NOTE — ED Notes (Signed)
Patient is discharging at this time. Patient denies SI,HI, and A/V/H with no plan or intent. Printed AVS reviewed with patient by provider along with follow up appointments and resources. Valuables and belongings returned to patient. Patient discharged by provider.

## 2022-12-21 NOTE — ED Provider Notes (Addendum)
Behavioral Health Urgent Care Medical Screening Exam  Patient Name: Margaret Alvarado MRN: [id number] Date of Evaluation: 12/21/22 Chief Complaint:  "My psychiatrist Saul Fordyce at [facility] recommended the chemical dependency program and referred me here." Diagnosis:  Final diagnoses:  Methamphetamine use (HCC)    History of Present illness: Margaret Alvarado is a 37 y.o. female patient with a past psychiatric history significant for MDD, ADHD, anxiety and polysubstance abuse (opiates and methamphetamines) who presents to the [facility] behavioral health urgent care voluntary unaccompanied with a chief complaint of "my psychiatrist Saul Fordyce at [facility] recommended the chemical dependency program and referred me here."  On evaluation, patient is alert and oriented x 4. Her thought process is linear and goal oriented. Her speech is clear and coherent. Her mood is anxious and affect is congruent. She has fair eye contact. She appears well groomed and is casually dressed. She is calm and cooperative and does not appear to be in acute distress. She denies SI/HI/AVH. There is no objective evidence that the patient is currently responding to internal or external stimuli on exam. Patient states that she was referred by her psychiatrist Saul Fordyce at [facility] to come here for an evaluation for the chemical dependency IOP program. She states that she relapsed on methamphetamines 6 months ago after a sobriety of 19 years. She reports previously using 3 to 4 grams of meth per week but states that she has decreased it to a 1/2 gram of meth per week. She reports last using meth two days ago. She reports withdrawal symptoms of cravings, irritability, poor sleep, decreased appetite and increased anxiety. She reports previously abusing opiates, Roxies and Oxycodone. She reports being off of opiates for 33 days. She denies past substance abuse treatment. She resides with her husband and 24 year  old son. 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 She reports a family psychiatric history of her mother was recently dx with dementia.   Flowsheet Row ED from 12/21/2022 in [facility] ED from 08/15/2022 in [facility] Emergency Department at [facility] ED from 10/05/2020 in [facility] at [facility] RISK CATEGORY No Risk No Risk No Risk       Psychiatric Specialty Exam  Presentation  General Appearance:Appropriate for Environment  Eye Contact:Fair  Speech:Clear and Coherent  Speech Volume:Normal  Handedness:Right   Mood and Affect  Mood: Anxious  Affect: Appropriate   Thought Process  Thought Processes: Coherent; Goal Directed  Descriptions of Associations:Intact  Orientation:Full (Time, Place and Person)  Thought Content:Logical    Hallucinations:None  Ideas of Reference:None  Suicidal Thoughts:No  Homicidal Thoughts:No   Sensorium  Memory: Immediate Fair; Recent Fair; Remote Fair  Judgment: Fair  Insight: Fair   Art therapist  Concentration: Fair  Attention Span: Fair  Recall: [employer] of Knowledge: Fair  Language: Fair   Psychomotor Activity  Psychomotor Activity: Normal   Assets  Assets: Manufacturing systems engineer; Desire for Improvement; Housing; Intimacy; Financial Resources/Insurance; Leisure Time; Physical Health; Social Lawyer; Resilience   Sleep  Sleep: Fair  Number of hours:  7   Physical Exam: Physical Exam HENT:     Nose: Nose normal.  Eyes:     Conjunctiva/sclera: Conjunctivae normal.  Cardiovascular:     Rate and Rhythm: Normal rate.  Pulmonary:  Effort: Pulmonary effort is normal.  Musculoskeletal:        General: Normal range of motion.     Cervical back: Normal range of motion.   Neurological:     Mental Status: She is alert and oriented to person, place, and time.    Review of Systems  Constitutional: Negative.   HENT: Negative.    Eyes: Negative.   Respiratory: Negative.    Cardiovascular: Negative.   Gastrointestinal: Negative.   Genitourinary: Negative.   Musculoskeletal: Negative.   Neurological: Negative.   Endo/Heme/Allergies: Negative.   Psychiatric/Behavioral:  Positive for substance abuse.    Blood pressure 108/74, pulse 95, temperature 98.4 F (36.9 C), temperature source Oral, resp. rate 18, SpO2 100 %. There is no height or weight on file to calculate BMI.  Musculoskeletal: Strength & Muscle Tone: within normal limits Gait & Station: normal Patient leans: N/A   BHUC MSE Discharge Disposition for Follow up and Recommendations: Based on my evaluation the patient does not appear to have an emergency medical condition and can be discharged with resources and follow up care in outpatient services for Substance Abuse Intensive Outpatient Program  [facility], CSW to refer patient to the [facility] IOP for substance abuse treatment.    Discharge recommendations:   Medications: Patient is to take medications as prescribed. No medication changes were made during your visit today. The patient or patient's guardian is to contact a medical professional and/or outpatient provider to address any new side effects that develop. The patient or the patient's guardian should update outpatient providers of any new medications and/or medication changes.   Therapy: We recommend that patient participate in individual therapy to address mental health concerns and substance abuse.  Safety:   The following safety precautions should be taken:   No sharp objects. This includes scissors, razors, scrapers, and putty knives.   Chemicals should be removed and locked up.   Medications should be removed and locked up.   Weapons should be removed and locked up. This  includes firearms, knives and instruments that can be used to cause injury.   The patient should abstain from use of illicit substances/drugs and abuse of any medications.  If symptoms worsen or do not continue to improve or if the patient becomes actively suicidal or homicidal then it is recommended that the patient return to the closest hospital emergency department, the [facility], or call 911 for further evaluation and treatment. National Suicide Prevention Lifeline 1-800-SUICIDE or (312) 811-7724.  About 988 988 offers 24/7 access to trained crisis counselors who can help people experiencing mental health-related distress. People can call or text 988 or chat 988lifeline.org for themselves or if they are worried about a loved one who may need crisis support.   Base on the information you have provided and the presenting issue, outpatient services and resources for have been recommended.  It is imperative that you follow through with treatment recommendations within 5-7 days from the of discharge to mitigate further risk to your safety and mental well-being. A list of referrals has been provided below to get you started.  You are not limited to the list provided.  In case of an urgent crisis, you may contact the Mobile Crisis Unit with [facility] at 1.820-254-2779.                   [facility]   Specialized Group Therapy for Substance Abuse If you have a substance abuse  disorder (with or without a mental health condition), you may benefit from specialized substance abuse therapy in a group setting. According to discharge survey data, 70 percent of patients completing our chemical dependency intensive outpatient program report fewer symptoms of substance abuse and incidents of relapse.  Under the guidance of a licensed mental health professional, meet with your peers every Monday, Wednesday and Friday from 9 a.m. to 12 p.m. for 6 to  8 weeks to:  Learn about chemical dependency, mental illness and co-occurring disorders. Develop relapse-prevention skills. Set personalized goals with your treatment team. To build on the skills you gain, you can attend Alcoholics Anonymous or Narcotics Anonymous meetings in the evenings and access follow-up care through weekly group meetings with peers. Ongoing support promotes wellness and recovery.  For more information, call [name], LCSW at [phone]. We work directly with employers and families to ensure you receive the care you need.    Katelyn Broadnax L, NP 12/21/2022, 2:26 PM

## 2022-12-22 ENCOUNTER — Telehealth (HOSPITAL_COMMUNITY): Payer: Self-pay | Admitting: Licensed Clinical Social Worker

## 2022-12-22 NOTE — Telephone Encounter (Signed)
The therapist attempts to reach [facility] leaving a HIPAA-compliant voicemail with his direct callback number which is [phone].   [address], MA, LCSW, [facility], LCAS 12/22/2022

## 2022-12-25 ENCOUNTER — Other Ambulatory Visit (HOSPITAL_BASED_OUTPATIENT_CLINIC_OR_DEPARTMENT_OTHER): Payer: Self-pay

## 2022-12-25 ENCOUNTER — Other Ambulatory Visit: Payer: Self-pay

## 2022-12-26 ENCOUNTER — Encounter (INDEPENDENT_AMBULATORY_CARE_PROVIDER_SITE_OTHER): Payer: Self-pay

## 2022-12-27 ENCOUNTER — Other Ambulatory Visit: Payer: Self-pay

## 2022-12-27 ENCOUNTER — Encounter (HOSPITAL_BASED_OUTPATIENT_CLINIC_OR_DEPARTMENT_OTHER): Payer: Self-pay

## 2022-12-27 DIAGNOSIS — F419 Anxiety disorder, unspecified: Secondary | ICD-10-CM | POA: Diagnosis not present

## 2022-12-27 DIAGNOSIS — T782XXA Anaphylactic shock, unspecified, initial encounter: Secondary | ICD-10-CM | POA: Diagnosis not present

## 2022-12-27 DIAGNOSIS — T7840XA Allergy, unspecified, initial encounter: Secondary | ICD-10-CM | POA: Diagnosis present

## 2022-12-27 DIAGNOSIS — R9431 Abnormal electrocardiogram [ECG] [EKG]: Secondary | ICD-10-CM | POA: Diagnosis not present

## 2022-12-27 NOTE — ED Triage Notes (Signed)
Pt arrives with c/o allergic reaction. Pt endorses whole body redness, swelling of her eye, and tingling sensation in her tongue. Pt recently started taking subutex. Pt denies itchiness of her throat or SOB.

## 2022-12-28 ENCOUNTER — Emergency Department (HOSPITAL_BASED_OUTPATIENT_CLINIC_OR_DEPARTMENT_OTHER)
Admission: EM | Admit: 2022-12-28 | Discharge: 2022-12-28 | Disposition: A | Discharge status: Home / Self Care | Payer: No Typology Code available for payment source | Attending: Emergency Medicine | Admitting: Emergency Medicine

## 2022-12-28 DIAGNOSIS — T782XXA Anaphylactic shock, unspecified, initial encounter: Secondary | ICD-10-CM

## 2022-12-28 MED ORDER — PREDNISONE 10 MG (21) PO TBPK: ORAL_TABLET | ORAL | 0 refills | Status: DC

## 2022-12-28 MED ORDER — EPINEPHRINE 0.3 MG/0.3ML IJ SOAJ
0.3000 mg | Freq: Once | INTRAMUSCULAR | Status: AC
  Administered 2022-12-28: 0.3 mg via INTRAMUSCULAR
  Filled 2022-12-28: qty 0.3

## 2022-12-28 MED ORDER — FAMOTIDINE IN NACL 20-0.9 MG/50ML-% IV SOLN
20.0000 mg | Freq: Once | INTRAVENOUS | Status: AC
  Administered 2022-12-28: 20 mg via INTRAVENOUS
  Filled 2022-12-28: qty 50

## 2022-12-28 MED ORDER — DIPHENHYDRAMINE HCL 50 MG/ML IJ SOLN
25.0000 mg | Freq: Once | INTRAMUSCULAR | Status: AC
  Administered 2022-12-28: 25 mg via INTRAVENOUS
  Filled 2022-12-28: qty 1

## 2022-12-28 MED ORDER — EPINEPHRINE 0.3 MG/0.3ML IJ SOAJ
0.3000 mg | Freq: Once | INTRAMUSCULAR | Status: DC
  Filled 2022-12-28: qty 0.3

## 2022-12-28 MED ORDER — METHYLPREDNISOLONE SODIUM SUCC 125 MG IJ SOLR
125.0000 mg | Freq: Once | INTRAMUSCULAR | Status: AC
  Administered 2022-12-28: 125 mg via INTRAVENOUS
  Filled 2022-12-28: qty 2

## 2022-12-28 MED ORDER — EPINEPHRINE 0.3 MG/0.3ML IJ SOAJ: 0.3000 mg | INTRAMUSCULAR | 0 refills | Status: AC | PRN

## 2022-12-28 NOTE — ED Notes (Signed)
Pt c/o feeling dizzy/heavy like she is going to pass out.

## 2022-12-28 NOTE — ED Provider Notes (Signed)
[place] EMERGENCY DEPARTMENT AT [facility]  Provider Note  CSN: [id number] Arrival date & time: 12/27/22 2351  History Chief Complaint  Patient presents with   Allergic Reaction    Margaret Alvarado is a 37 y.o. female presents for evaluation of allergic reaction started a short time ago with itching on hands and feet, progressed to hives. She began feeling anxious and took two benadryl and a xanax. She denies any throat swelling but states tongue feels tingly.    Home Medications Prior to Admission medications   Medication Sig Start Date End Date Taking? Authorizing Provider  predniSONE (STERAPRED UNI-PAK 21 TAB) 10 MG (21) TBPK tablet 10mg  Tabs, 6 day taper. Use as directed 12/28/22  Yes Pollyann Savoy, MD  albuterol (VENTOLIN HFA) 108 (90 Base) MCG/ACT inhaler Inhale 2 puffs into the lungs every 6 (six) hours as needed for wheezing or shortness of breath. 03/02/22   Waldon Merl, PA-C  ALPRAZolam Prudy Feeler) 0.5 MG tablet Take 1 tablet (0.5 mg total) by mouth 2 (two) times daily as needed for anxiety/panic attacks 10/20/22     benzonatate (TESSALON) 100 MG capsule Take 1 capsule (100 mg total) by mouth 3 (three) times daily as needed. 06/12/22   Margaretann Loveless, PA-C  Buprenorphine HCl-Naloxone HCl 4-1 MG FILM Dissolve 1 film under the tongue 2 times daily. **Max daily dose: 2 films** 11/29/21     buPROPion (WELLBUTRIN XL) 150 MG 24 hr tablet Take 1 tablet (150 mg total) by mouth every morning. 08/20/22   Mozingo, Thereasa Solo, NP  cephALEXin (KEFLEX) 500 MG capsule Take 1 capsule by mouth 2 times daily 10/05/21   Daphine Deutscher, Mary-Margaret, FNP  fluticasone (FLONASE) 50 MCG/ACT nasal spray Place 2 sprays into both nostrils daily. 06/12/22   Margaretann Loveless, PA-C  methylphenidate (RITALIN) 20 MG tablet Take 1 tablet (20 mg)  by mouth every morning and afternoon for ADHD 11/18/22     methylphenidate (RITALIN) 20 MG tablet Take 1 tablet (20 mg)  by mouth every  morning and afternoon for ADHD 12/18/22     methylphenidate (RITALIN) 20 MG tablet Take 1 tablet (20 mg) by mouth every morning and afternoon for ADHD 10/20/22     mupirocin ointment (BACTROBAN) 2 % Apply 1 Application topically 2 (two) times daily. 03/19/22   Claiborne Rigg, NP  oxyCODONE-acetaminophen (PERCOCET/ROXICET) 5-325 MG tablet Take 1 tablet by mouth every 6 (six) hours as needed for severe pain. 08/15/22   Smoot, Shawn Route, PA-C  promethazine-dextromethorphan (PROMETHAZINE-DM) 6.25-15 MG/5ML syrup Take 5 mLs by mouth 4 (four) times daily as needed. 06/12/22   Margaretann Loveless, PA-C  sertraline (ZOLOFT) 100 MG tablet Take 1 tablet (100 mg total) by mouth daily. 03/21/22   Mozingo, Thereasa Solo, NP  sertraline (ZOLOFT) 100 MG tablet Take 1 tablet (100 mg total) by mouth daily. 07/31/22   Mozingo, Thereasa Solo, NP  sertraline (ZOLOFT) 100 MG tablet Take 1 tablet (100 mg total) by mouth daily for anxiety/depression 10/20/22     sulfamethoxazole-trimethoprim (BACTRIM DS) 800-160 MG tablet Take 1 tablet by mouth 2 (two) times daily. 08/14/22   Junie Spencer, FNP     Allergies    Patient has no known allergies.   Review of Systems   Review of Systems Please see HPI for pertinent positives and negatives  Physical Exam BP 100/61   Pulse 83   Temp 99 F (37.2 C)   Resp 13   SpO2 96%   Physical  Exam Vitals and nursing note reviewed.  Constitutional:      Appearance: Normal appearance.  HENT:     Head: Normocephalic and atraumatic.     Nose: Nose normal.     Mouth/Throat:     Mouth: Mucous membranes are moist.     Comments: No angioedema of tongue or oropharynx Eyes:     Extraocular Movements: Extraocular movements intact.     Conjunctiva/sclera: Conjunctivae normal.  Cardiovascular:     Rate and Rhythm: Normal rate.  Pulmonary:     Effort: Pulmonary effort is normal.     Breath sounds: Normal breath sounds. No stridor.  Abdominal:     General: Abdomen is flat.      Palpations: Abdomen is soft.     Tenderness: There is no abdominal tenderness.  Musculoskeletal:        General: No swelling. Normal range of motion.     Cervical back: Neck supple.  Skin:    General: Skin is warm and dry.     Findings: Rash (diffuse hives on arms, legs and trunk) present.  Neurological:     General: No focal deficit present.     Mental Status: She is alert.  Psychiatric:        Mood and Affect: Mood normal.     ED Results / Procedures / Treatments   EKG EKG Interpretation  Date/Time:  Thursday December 28 2022 00:00:55 EDT Ventricular Rate:  98 PR Interval:  136 QRS Duration: 92 QT Interval:  353 QTC Calculation: 451 R Axis:   69 Text Interpretation: Duplicate Confirmed by Susy Frizzle [phone]) on 12/28/2022 12:27:48 AM  Procedures .Critical Care  Performed by: Pollyann Savoy, MD Authorized by: Pollyann Savoy, MD   Critical care provider statement:    Critical care time (minutes):  45   Critical care time was exclusive of:  Separately billable procedures and treating other patients   Critical care was time spent personally by me on the following activities:  Development of treatment plan with patient or surrogate, discussions with consultants, evaluation of patient's response to treatment, examination of patient, ordering and review of laboratory studies, ordering and review of radiographic studies, ordering and performing treatments and interventions, pulse oximetry, re-evaluation of patient's condition and review of old charts   Medications Ordered in the ED Medications  EPINEPHrine (EPI-PEN) injection 0.3 mg (has no administration in time range)  diphenhydrAMINE (BENADRYL) injection 25 mg (25 mg Intravenous Given 12/28/22 0031)  famotidine (PEPCID) IVPB 20 mg premix (0 mg Intravenous Stopped 12/28/22 0103)  methylPREDNISolone sodium succinate (SOLU-MEDROL) 125 mg/2 mL injection 125 mg (125 mg Intravenous Given 12/28/22 0029)  EPINEPHrine (EPI-PEN)  injection 0.3 mg (0.3 mg Intramuscular Given 12/28/22 0027)    Initial Impression and Plan  Patient here with allergic reaction. Airway is clear but complaining of tongue tingling. Will give IV meds for reaction, observe in ED for worsening.   ED Course   Clinical Course as of 12/28/22 0432  Thu Dec 28, 2022  0314 Patient sleeping soundly, BP soft but does not appear to be significantly off her baseline. Rash has resolved. Will continue to observe for 4 hours post EpiPen.  [CS]  [id number] Patient remains asymptomatic and ready to go home. Rx for pred-pak and EpiPen. RTED if symptoms return. Otherwise PCP follow up.  [CS]    Clinical Course User Index [CS] Pollyann Savoy, MD     MDM Rules/Calculators/A&P Medical Decision Making Given presenting complaint, I considered that admission might  be necessary. After review of results from ED lab and/or imaging studies, admission to the hospital is not indicated at this time.    Problems Addressed: Anaphylaxis, initial encounter: acute illness or injury  Risk Prescription drug management. Decision regarding hospitalization.     Final Clinical Impression(s) / ED Diagnoses Final diagnoses:  Anaphylaxis, initial encounter    Rx / DC Orders ED Discharge Orders          Ordered    predniSONE (STERAPRED UNI-PAK 21 TAB) 10 MG (21) TBPK tablet        12/28/22 0430             Pollyann Savoy, MD 12/28/22 [phone]

## 2022-12-29 ENCOUNTER — Telehealth: Payer: Self-pay | Admitting: Nurse Practitioner

## 2022-12-29 ENCOUNTER — Ambulatory Visit: Payer: No Typology Code available for payment source | Admitting: Internal Medicine

## 2022-12-29 NOTE — Telephone Encounter (Signed)
2nd new pt no show at our office/ did not send no show letter but I did block the pt for future schedule

## 2023-01-02 DIAGNOSIS — F1510 Other stimulant abuse, uncomplicated: Secondary | ICD-10-CM | POA: Diagnosis not present

## 2023-01-02 DIAGNOSIS — F4310 Post-traumatic stress disorder, unspecified: Secondary | ICD-10-CM | POA: Diagnosis not present

## 2023-01-02 DIAGNOSIS — F17200 Nicotine dependence, unspecified, uncomplicated: Secondary | ICD-10-CM | POA: Diagnosis not present

## 2023-01-02 DIAGNOSIS — F1110 Opioid abuse, uncomplicated: Secondary | ICD-10-CM | POA: Diagnosis not present

## 2023-01-02 DIAGNOSIS — F1310 Sedative, hypnotic or anxiolytic abuse, uncomplicated: Secondary | ICD-10-CM | POA: Diagnosis not present

## 2023-01-02 DIAGNOSIS — F411 Generalized anxiety disorder: Secondary | ICD-10-CM | POA: Diagnosis not present

## 2023-01-02 DIAGNOSIS — F1011 Alcohol abuse, in remission: Secondary | ICD-10-CM | POA: Diagnosis not present

## 2023-01-02 DIAGNOSIS — F1120 Opioid dependence, uncomplicated: Secondary | ICD-10-CM | POA: Diagnosis not present

## 2023-01-02 DIAGNOSIS — F1491 Cocaine use, unspecified, in remission: Secondary | ICD-10-CM | POA: Diagnosis not present

## 2023-01-09 ENCOUNTER — Telehealth (HOSPITAL_COMMUNITY): Payer: Self-pay | Admitting: Licensed Clinical Social Worker

## 2023-01-09 NOTE — Telephone Encounter (Signed)
The therapist calls Margaret Alvarado confirming her identity via two identifiers. She says that she is no longer taking Xanax or Ritalin and that she last used meth about a week ago and no opiates for at least 30 days prior to being seen at the [facility] in June. She says that she had 17 years sober but that the controlled substances triggered a relapse such that she will not do this again. She says that she is attending some Twelve Step meetings but needs something more intensive.  The therapist answers her questions about the [facility] IOP and scheduled her to come for a CCA on 01/11/23 at 11 a.m. She is to arrive at 10:30 a.m. to do initial paperwork.  [name], MA, LCSW, [facility], LCAS 01/09/2023

## 2023-01-10 DIAGNOSIS — F4310 Post-traumatic stress disorder, unspecified: Secondary | ICD-10-CM | POA: Diagnosis not present

## 2023-01-10 DIAGNOSIS — F1491 Cocaine use, unspecified, in remission: Secondary | ICD-10-CM | POA: Diagnosis not present

## 2023-01-10 DIAGNOSIS — F1510 Other stimulant abuse, uncomplicated: Secondary | ICD-10-CM | POA: Diagnosis not present

## 2023-01-10 DIAGNOSIS — F1310 Sedative, hypnotic or anxiolytic abuse, uncomplicated: Secondary | ICD-10-CM | POA: Diagnosis not present

## 2023-01-10 DIAGNOSIS — F17200 Nicotine dependence, unspecified, uncomplicated: Secondary | ICD-10-CM | POA: Diagnosis not present

## 2023-01-10 DIAGNOSIS — F411 Generalized anxiety disorder: Secondary | ICD-10-CM | POA: Diagnosis not present

## 2023-01-10 DIAGNOSIS — F1120 Opioid dependence, uncomplicated: Secondary | ICD-10-CM | POA: Diagnosis not present

## 2023-01-10 DIAGNOSIS — F1011 Alcohol abuse, in remission: Secondary | ICD-10-CM | POA: Diagnosis not present

## 2023-01-10 DIAGNOSIS — F1110 Opioid abuse, uncomplicated: Secondary | ICD-10-CM | POA: Diagnosis not present

## 2023-01-11 ENCOUNTER — Encounter (HOSPITAL_COMMUNITY): Payer: Self-pay

## 2023-01-11 ENCOUNTER — Ambulatory Visit (INDEPENDENT_AMBULATORY_CARE_PROVIDER_SITE_OTHER): Payer: No Typology Code available for payment source | Admitting: Licensed Clinical Social Worker

## 2023-01-11 ENCOUNTER — Telehealth (HOSPITAL_COMMUNITY): Payer: Self-pay | Admitting: Licensed Clinical Social Worker

## 2023-01-11 ENCOUNTER — Encounter (HOSPITAL_COMMUNITY): Payer: Self-pay | Admitting: Licensed Clinical Social Worker

## 2023-01-11 DIAGNOSIS — F17200 Nicotine dependence, unspecified, uncomplicated: Secondary | ICD-10-CM

## 2023-01-11 DIAGNOSIS — F1520 Other stimulant dependence, uncomplicated: Secondary | ICD-10-CM | POA: Diagnosis not present

## 2023-01-11 DIAGNOSIS — F39 Unspecified mood [affective] disorder: Secondary | ICD-10-CM

## 2023-01-11 DIAGNOSIS — F1021 Alcohol dependence, in remission: Secondary | ICD-10-CM

## 2023-01-11 DIAGNOSIS — F1120 Opioid dependence, uncomplicated: Secondary | ICD-10-CM

## 2023-01-11 NOTE — Telephone Encounter (Signed)
The therapist attempts to reach [facility] after calling her insurance carrier; however, her voicemail box is full.  [name], MA, LCSW, [facility], LCAS 01/11/2023

## 2023-01-11 NOTE — Telephone Encounter (Signed)
The therapist attempts to reach [facility] but her voicemail remains full.  [name], MA, LCSW, [facility], LCAS 01/11/2023
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
## 2023-01-12 ENCOUNTER — Telehealth (HOSPITAL_COMMUNITY): Payer: Self-pay | Admitting: Licensed Clinical Social Worker

## 2023-01-12 NOTE — Telephone Encounter (Signed)
The therapist attempts to reach [facility] leaving a HIPAA-compliant voicemail.  [name], MA, LCSW, [facility], LCAS 01/12/2023

## 2023-01-15 ENCOUNTER — Telehealth (HOSPITAL_COMMUNITY): Payer: Self-pay | Admitting: Licensed Clinical Social Worker

## 2023-01-15 NOTE — Telephone Encounter (Signed)
The therapist returns Margaret Alvarado's call confirming her identity via two identifiers. She says that she is sick as is her family and her husband's office.   She concludes that per CDC guideline for COVID that she should be able to start group on Wednesday if, worst case scenario, she did have COVID.   [name], MA, LCSW, [facility], LCAS 01/15/2023

## 2023-01-15 NOTE — Telephone Encounter (Signed)
The therapist returns Margaret Alvarado's calls leaving a HIPAA-compliant voicemail.  [name], MA, LCSW, [facility], LCAS 01/15/2023

## 2023-01-16 ENCOUNTER — Ambulatory Visit: Payer: No Typology Code available for payment source | Admitting: Family Medicine

## 2023-01-17 ENCOUNTER — Ambulatory Visit (HOSPITAL_COMMUNITY): Payer: No Typology Code available for payment source

## 2023-01-17 ENCOUNTER — Telehealth (HOSPITAL_COMMUNITY): Payer: Self-pay | Admitting: Licensed Clinical Social Worker

## 2023-01-17 ENCOUNTER — Encounter (HOSPITAL_COMMUNITY): Payer: Self-pay

## 2023-01-17 NOTE — Telephone Encounter (Signed)
The therapist receives the following email from [place]:  "Hi Mr. Gerre Pebbles. I meant to send this sooner but the day got away from me. Everyone seems to be feeling better aside from my husband. He's been alot better this evening but I think I should air on the side of caution and hang back one more day. This has been alittle rough for everyone. I will email you and let you know for sure before group starts at 9. Have a good night!   Iyahna Obriant[address] [place], LCSW, [facility], LCAS 01/17/2023

## 2023-01-19 ENCOUNTER — Ambulatory Visit (INDEPENDENT_AMBULATORY_CARE_PROVIDER_SITE_OTHER): Payer: No Typology Code available for payment source

## 2023-01-19 DIAGNOSIS — F1120 Opioid dependence, uncomplicated: Secondary | ICD-10-CM

## 2023-01-19 DIAGNOSIS — F1520 Other stimulant dependence, uncomplicated: Secondary | ICD-10-CM | POA: Diagnosis not present

## 2023-01-19 DIAGNOSIS — F1021 Alcohol dependence, in remission: Secondary | ICD-10-CM

## 2023-01-19 NOTE — Progress Notes (Signed)
Daily Group Progress Note   Program: CD IOP   Group Time: 9 a.m. to 12:00 p.m.   Type of Therapy: Process and Psychoeducational   Topic: The therapist checks in with group members, assesses for SI/HI/psychosis and overall level of functioning. The therapist inquires about sobriety date and number of community support meetings attended since last session.   Therapists welcome and introduce a new group member.  After a group member reads from "A Daily Meditation", therapists facilitate discussion the meditation of "false pride", speficially when one thinks they can do recovery in solitude.  Therapists further discuss how having a "Higher Power" can provide a faith that helps people believe they can get through the suffering of emotions and see a purpose to their suffering.     Therapists prompt group members to share their stories of their journey. Group members share issues of shame and trauma in relation to being adult children of addicts.   In response to group members sharing their stories and using the verbiage "of when they started drinking alcoholically", therapists discuss the genetically predisposed factor in addicts emphasizing addiction did not just start when they started drinking heavier.  Therapists elaborate on how addiction is a chronic and progressive disease. Several group members share how their use of alcohol and drugs has eased their social anxiety. Therapists explain how using can cover up the other issues that group members are experiencing and that using only delays their processing those emotions.  Therapists discuss self-care for addicts, including workaholics.      Summary: This is Heathers first session in CD-IOP. Lyliana rates her depression as a "3" and her anxiety as a "8".  She shares her emotion today is "super anxious".  Chaunda is tearful and crying today. Dametria shares she has a new sobriety date which is 01-14-23.  Syleena shares her story, stating that she would  get drunk to mask her emotions.  Chinenye says she came to [facility] to detox and then came to CD-IOP.  Berdina says she has never been in treatment, previously.  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  Trenisha says her first use of alcohol was at age  37 and her first use of drugs was at age 34.  She discusses having children in her late teens.  Joyice says her mother in law introduced her to Meth.  Karelly shares that she almost lost custody of her kids. Levana reports she had 17 years of sobriety, but in December 2023, her mother moved in with her due to her mother's onset of dementia she relapsed on alcohol and stopped on 01-13-23.  She reports her drug use continued until she came for detox recently. Thus, Lavanya has not been completely sober since age 68.   Progress Towards Goals: reports last use on 01-13-23.   UDS collected: No   Results: none   AA/NA attended?: Yes   Sponsor?: no     Remigio Eisenmenger, MS, LMFT, [address] [place], [place], [place], [facility], LCAS 01-19-2023

## 2023-01-22 ENCOUNTER — Ambulatory Visit (INDEPENDENT_AMBULATORY_CARE_PROVIDER_SITE_OTHER): Payer: No Typology Code available for payment source | Admitting: Licensed Clinical Social Worker

## 2023-01-22 DIAGNOSIS — F1120 Opioid dependence, uncomplicated: Secondary | ICD-10-CM | POA: Diagnosis not present

## 2023-01-22 DIAGNOSIS — F17200 Nicotine dependence, unspecified, uncomplicated: Secondary | ICD-10-CM

## 2023-01-22 DIAGNOSIS — F39 Unspecified mood [affective] disorder: Secondary | ICD-10-CM

## 2023-01-22 DIAGNOSIS — F1021 Alcohol dependence, in remission: Secondary | ICD-10-CM | POA: Diagnosis not present

## 2023-01-22 DIAGNOSIS — F1520 Other stimulant dependence, uncomplicated: Secondary | ICD-10-CM

## 2023-01-22 NOTE — Progress Notes (Addendum)
Daily Group Progress Note  Program: [facility]   Group Time: 9 a.m. to 12 p.m.   Type of Therapy: Process and Psychoeducational   Topic: The therapists check in with group members, assess for SI/HI/psychosis and overall level of functioning. The therapists inquire about sobriety date and number of community support meetings attended since last session.   The therapists discuss how feeling shame and guilt over using is part of the addiction cycle and counterproductive. The therapists encourage group members to instead be solution-focused coming up with a relapse prevention plan aimed at determining how they will remove triggers and/or respond differently when confronted with the same triggers. The therapists point out that members can find meetings in the middle of the night, on-line in different time zones and in different countries. The therapists also explain how one should not wrestle with a thought or urge to drink in isolation but should tell on one's self via calling someone in the program or one's Sponsor to disclose that he or she is thinking of drinking. The therapists agree with one group member's suggestion that using relaxation, guided imagery audios prior to bedtime can help improve sleep while noting other sleep hygiene techniques.   The therapists begin showing the video, "Breaking the Addiction Cycle" with Delsa Sale having them complete the first exercise in the video concerning family attitudes about alcohol and drugs and concerning what they thought an alcoholic or addict looked like when they were children.     Summary: Allicia presents rating her depression as a "0" and her anxiety as an "8."   She describes her emotions as "anxious" and "super-emotional." She says that the weekend started out as good but she has a new sober date.  Sydny says that she became bored so started "pulling stuff out of cabinets" and ran across a stash using out of "weakness" for which she has  mentally been beating herself up since.   The therapists question what Timika can do differently with Audrielle agreeing that she could have a non-using person assure she has no remaining stashes in the house. She likes the idea of accessing on-line meetings in different time zones and other countries if she is up in the middle of the night as she indicates that this is when she is most bored and most triggered to use. 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 The therapists observe that being off all her meds is an issue suggesting that when she meets with the PA-C that he hopefully can put her on a non-controlled sleep aid such that she is asleep a night.   Kelcy says that alcohol use was not accepted in her family as it was said that if one drank that she would possibly end up like her father who was an alcoholic. She says that her father was in end stage cirrhosis in his late 37's when she was in her 54's and he died in a house fire when drunk and fell asleep with a cigarette that ignited his sofa.  Meka recalls sitting in her dad's lap as a child when her father and uncle were smoking pot and admits that she could not wait to smoke pot as a child. Graceland says that her family never talked about drugs being a good or bad thing and that her mom used pills.   Progress Towards Goals: Karmina used drugs over the weekend.  UDS collected: Yes  Results: No  AA/NA attended?: No  Sponsor?: No   [name], MA, LCSW, LCMHC, LCAS Remigio Eisenmenger, MS, LMFT, LCAS 01/22/2023

## 2023-01-24 ENCOUNTER — Ambulatory Visit (INDEPENDENT_AMBULATORY_CARE_PROVIDER_SITE_OTHER): Payer: No Typology Code available for payment source | Admitting: Licensed Clinical Social Worker

## 2023-01-24 ENCOUNTER — Encounter (HOSPITAL_COMMUNITY): Payer: Self-pay | Admitting: Medical

## 2023-01-24 VITALS — BP 124/85 | HR 110 | Ht 67.5 in | Wt 129.0 lb

## 2023-01-24 DIAGNOSIS — K029 Dental caries, unspecified: Secondary | ICD-10-CM

## 2023-01-24 DIAGNOSIS — F1120 Opioid dependence, uncomplicated: Secondary | ICD-10-CM | POA: Diagnosis not present

## 2023-01-24 DIAGNOSIS — F4310 Post-traumatic stress disorder, unspecified: Secondary | ICD-10-CM

## 2023-01-24 DIAGNOSIS — F1520 Other stimulant dependence, uncomplicated: Secondary | ICD-10-CM

## 2023-01-24 DIAGNOSIS — F1994 Other psychoactive substance use, unspecified with psychoactive substance-induced mood disorder: Secondary | ICD-10-CM

## 2023-01-24 DIAGNOSIS — Z6372 Alcoholism and drug addiction in family: Secondary | ICD-10-CM

## 2023-01-24 DIAGNOSIS — F17200 Nicotine dependence, unspecified, uncomplicated: Secondary | ICD-10-CM | POA: Diagnosis not present

## 2023-01-24 DIAGNOSIS — F989 Unspecified behavioral and emotional disorders with onset usually occurring in childhood and adolescence: Secondary | ICD-10-CM

## 2023-01-24 DIAGNOSIS — Z8659 Personal history of other mental and behavioral disorders: Secondary | ICD-10-CM | POA: Diagnosis not present

## 2023-01-24 DIAGNOSIS — F19980 Other psychoactive substance use, unspecified with psychoactive substance-induced anxiety disorder: Secondary | ICD-10-CM

## 2023-01-24 DIAGNOSIS — F1020 Alcohol dependence, uncomplicated: Secondary | ICD-10-CM

## 2023-01-24 MED ORDER — BACLOFEN 10 MG PO TABS: 10.0000 mg | ORAL_TABLET | Freq: Three times a day (TID) | ORAL | 1 refills | Status: DC

## 2023-01-24 MED ORDER — BUSPIRONE HCL 10 MG PO TABS: 10.0000 mg | ORAL_TABLET | Freq: Three times a day (TID) | ORAL | 2 refills | Status: DC
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
## 2023-01-24 NOTE — Progress Notes (Signed)
Daily Group Progress Note  Program: CD IOP   Group Time: 9:35 a.m. to 12 p.m.   Type of Therapy: Process and Psychoeducational   Topic: The therapists check in with group members, assess for SI/HI/psychosis and overall level of functioning. The therapists inquire about sobriety date and number of community support meetings attended since last session.    The therapists focus today primarily on the necessity of learning to reach out to sober sobers in the program while noting that this is not a behavior that people in early recovery are comfortable in doing. The therapists emphasize the importance of honesty in recovery and in avoiding all people who use substances in addition to toxic relationships in which substance use is not involved as these types of relationships lead to emotional relapse which in turn will lead to behavioral relapse. The therapists validate that recovery is not a perfect thing but messy with real progress being two steps forward and one step back. Additionally, it is pointed out that times in which people feel unworthy or too ashamed to return to meetings is exactly when they need to go to meetings and that if they "keep coming back" that things will finally turn a corner. Lastly, the therapists observe that avoiding discomfort is at the core of addiction while learning to sit with uncomfortable emotions is a the core of recovery.   Summary: Margaret Alvarado presents rating her depression as a "3" and her anxiety as an "8." She arrives late for group.   She says that she has to tell her 24 year-old son that he cannot come over to her house as she is now smoking "gas station heroin." Margaret Alvarado says that she has the same sobriety date but is "hanging on for dear life" as she feels "like TV static."   She is going to pick her niece and nephew up today for her sister but take them to her adult daughter's to watch them. She adds that she is going to have to explain to her sister that she is  in early recovery and cannot step in to help as much saying that her sister has relied more on her as their mother has dementia.   In talking about the possibility of a family meeting, Margaret Alvarado says that the person she most needs to get in is her husband who does not understand why she cannot "just quit" while he has only 4 months of sobriety. She schedules a meeting with this therapist for next week which she believes he can take lunch to attend.   She says that she plans on attending a meeting this evening and taking her 52 year-old son with her. She has a dental appointment with an extraction this Friday but says that she is going to do everything possible to come to group even if late admitting that she "needs" this group.   Progress Towards Goals: Margaret Alvarado reports no drug or alcohol use  UDS collected: No  Results: No  AA/NA attended?: No  Sponsor?: No   Margaret Blazer, MA, LCSW, LCMHC, LCAS Margaret Eisenmenger, MS, LMFT, LCAS 01/24/2023

## 2023-01-26 ENCOUNTER — Ambulatory Visit (HOSPITAL_COMMUNITY): Payer: No Typology Code available for payment source

## 2023-01-29 ENCOUNTER — Ambulatory Visit (INDEPENDENT_AMBULATORY_CARE_PROVIDER_SITE_OTHER): Payer: No Typology Code available for payment source

## 2023-01-29 DIAGNOSIS — F1021 Alcohol dependence, in remission: Secondary | ICD-10-CM | POA: Diagnosis not present

## 2023-01-29 DIAGNOSIS — F1020 Alcohol dependence, uncomplicated: Secondary | ICD-10-CM

## 2023-01-29 DIAGNOSIS — F1920 Other psychoactive substance dependence, uncomplicated: Secondary | ICD-10-CM

## 2023-01-29 DIAGNOSIS — F1520 Other stimulant dependence, uncomplicated: Secondary | ICD-10-CM | POA: Diagnosis not present

## 2023-01-29 DIAGNOSIS — F1120 Opioid dependence, uncomplicated: Secondary | ICD-10-CM | POA: Diagnosis not present

## 2023-01-29 NOTE — Progress Notes (Signed)
Daily Group Progress Note  Program: CD IOP   Group Time: 10:20 a.m. to 12 p.m.   Type of Therapy: Process and Psychoeducational   Topic: The therapists check in with group members, assess for SI/HI/psychosis and overall level of functioning. The therapists inquire about sobriety date and number of community support meetings attended since last session.  Therapists discuss the importance of engaging in community in early recovery, as recovery does occur in community.  Therapists elaborate on how a sponsor  and twelve step meetings can be of support.  Therapists explore what fears and objection  group members have with getting a sponsor.  Therapist discuss the issues of whether an person in active addiction can be mature, as one is not at the point to allow themselves to sit with uncomfortable feelings without wanting to medicate them away.     Summary: She arrives late for group. Kita rates her depression as a "2 and her anxiety as a "8" .  Alaura shares that her emotions for today are "hopeful and overwhelmed". Joell says she is having problems trusting the people in twelve step meetings.  She says she wants to realize that some people are trust worthy.  She says she realizes that her history of her father being an alcoholic and getting out of meetings and going to get a case of beer signaled to her that he was not serious about his recovery. Hayes says she has seen several people in meetings that  are doing well. Loletia says she was pissed off all weekend because she had dental surgery and knew she could not take the pain medicine prescribed to her.  Therapist noted she should give herself credit as she was able to go through the procedure and not use.    Progress Towards Goals: Nike reports no drug or alcohol use  UDS collected: No  Results: No  AA/NA attended?: No  Sponsor?: No   [name], MA, LCSW, LCMHC, LCAS Remigio Eisenmenger, MS, LMFT, LCAS 01/29/2023

## 2023-01-31 ENCOUNTER — Ambulatory Visit (HOSPITAL_COMMUNITY): Payer: No Typology Code available for payment source

## 2023-01-31 ENCOUNTER — Encounter (HOSPITAL_COMMUNITY): Payer: Self-pay

## 2023-01-31 ENCOUNTER — Telehealth (HOSPITAL_COMMUNITY): Payer: Self-pay | Admitting: Licensed Clinical Social Worker

## 2023-01-31 NOTE — Telephone Encounter (Signed)
Marianthi sends the following email to this therapist today:  "Hi! I am so sorry that I forgot to send this email to you this morning. My daughter and I overslept today after a pretty long night finishing an essay for one of her classes that was due this morning. I apologize for missing group and not advising you guys before now. My husband and I will be at the appointment tomorrow. And I am getting ready for a 1:30 meeting right now.   Pearley Amsberry[address] [place], LCSW, [facility], LCAS 01/31/2023

## 2023-02-01 ENCOUNTER — Ambulatory Visit (INDEPENDENT_AMBULATORY_CARE_PROVIDER_SITE_OTHER): Payer: No Typology Code available for payment source | Admitting: Licensed Clinical Social Worker

## 2023-02-01 DIAGNOSIS — F1020 Alcohol dependence, uncomplicated: Secondary | ICD-10-CM

## 2023-02-01 DIAGNOSIS — F1520 Other stimulant dependence, uncomplicated: Secondary | ICD-10-CM

## 2023-02-01 DIAGNOSIS — F1120 Opioid dependence, uncomplicated: Secondary | ICD-10-CM

## 2023-02-01 DIAGNOSIS — F4310 Post-traumatic stress disorder, unspecified: Secondary | ICD-10-CM

## 2023-02-01 NOTE — Progress Notes (Signed)
THERAPIST PROGRESS NOTE  Session Time: 2:07 p.m. to 2:58 p.m.   Type of Therapy: Individual   Therapist Response/Interventions: Solution-Focused Therapy/The therapist educates Margaret Alvarado on PAWS validating that she will feel bad for a while but not forever and that she must ride out this uncomfortable healing process in order to get better in the long-run. The therapist notes that Margaret Alvarado's not jumping to do everything for her so is a positive regardless as doing what she was doing was not allowing him to grow up and was enabling him.   Treatment Goals addressed:  Active     Substance Use and Unspecified Affective Disorder     Margaret Alvarado will report not using drugs 90 out of 90 days per weekly UDS while attending no less than three recovery supports groups per week and obtaining a Sponsor.  (Not Applicable)     Start:  01/11/23    Expected End:  04/13/23    Resolved:  01/11/23      Margaret Alvarado will experience a decrease in her depression and anxiety as evidenced by her PHQ-9 and GAD-7 both being a 4 or less.  (Not Applicable)     Start:  01/11/23    Expected End:  04/13/23    Resolved:  01/11/23      Therapist will assist Margaret Alvarado in identifying and avoiding triggers for use and assist her in overcoming barriers to developing a sober support system.     Start:  01/11/23         Therapist will assist Margaret Alvarado in identifying and changing thoughts and behaviors contributing to her depression and anxiety.     Start:  01/11/23      Margaret Alvarado gives permission for this therapist to electronically sign her Care Plan on her behalf.          Summary: Margaret Alvarado presents without her husband as he had a doctor's appointment for a condition she believe is stress-related. She is having extreme problems focusing saying that her mind feels "scrambled almost." She says that it was hard to put together sentences last night. She attended a meeting yesterday and her husband is going to attend a meeting with her  tonight.  She is having to say "no" to her kids and her sister being proud of this noting that she is having to let her 39 year-old, who lives with his father, walk the 5 minutes he needs to walk to get to the store. Her son does not want to go to college and his smoking [employer].   The major focus of the session is in getting Margaret Alvarado to understand PAWS as her brain healing and part of her medical condition; however, one which will require her to reduce stress and unnecessary demands in her life as she would do if sick with influenza or some other illness.   Progress Towards Goals: Progressing  Suicidal/Homicidal: No SI or HI  Plan: Margaret Alvarado will talk to her husband to see when he is able to schedule the conjoint session with her and this therapist.  Diagnosis: Alcohol Use Disorder, Severe; Severe Stimulant Use Disorder; Opioid Use Disorder, Severe; and PTSD  Collaboration of Care: Therapist messages the PA-C about Margaret Alvarado's sleep problems and possible need for a non-addictive sleep medication.   Patient/Guardian was advised Release of Information must be obtained prior to any record release in order to collaborate their care with an outside provider. Patient/Guardian was advised if they have not already done so to contact the registration department to sign all  necessary forms in order for [place] to release information regarding their care.   Consent: Patient/Guardian gives verbal consent for treatment and assignment of benefits for services provided during this visit. Patient/Guardian expressed understanding and agreed to proceed.   Margaret Blazer, MA, LCSW, [facility], LCAS 02/01/2023

## 2023-02-02 ENCOUNTER — Encounter (HOSPITAL_COMMUNITY): Payer: Self-pay

## 2023-02-02 ENCOUNTER — Telehealth (HOSPITAL_COMMUNITY): Payer: Self-pay | Admitting: Licensed Clinical Social Worker

## 2023-02-02 ENCOUNTER — Ambulatory Visit (HOSPITAL_COMMUNITY): Payer: No Typology Code available for payment source

## 2023-02-02 NOTE — Telephone Encounter (Signed)
The therapist receives the following email from [place] this morning:  "Hi! I am going to break away from all this noise and disconnect with my family today and tomorrow. We found a place in [place] that's just far enough it kind of separates [place] from reality for the weekend. This was a last minute thing as far as letting anyone know but I will take what I can get when it comes to having everyone together and clearing my head. See you guys Monday! I also believe I have found a meeting that actually enjoy and have some leads on a couple of sponsors!"  [place], [place], [place], [facility], LCAS 02/02/2023

## 2023-02-05 ENCOUNTER — Telehealth: Payer: No Typology Code available for payment source | Admitting: Physician Assistant

## 2023-02-05 ENCOUNTER — Ambulatory Visit (INDEPENDENT_AMBULATORY_CARE_PROVIDER_SITE_OTHER): Payer: No Typology Code available for payment source | Admitting: Medical

## 2023-02-05 ENCOUNTER — Telehealth (HOSPITAL_COMMUNITY): Payer: Self-pay | Admitting: Licensed Clinical Social Worker

## 2023-02-05 DIAGNOSIS — F1020 Alcohol dependence, uncomplicated: Secondary | ICD-10-CM | POA: Diagnosis not present

## 2023-02-05 DIAGNOSIS — F4310 Post-traumatic stress disorder, unspecified: Secondary | ICD-10-CM

## 2023-02-05 DIAGNOSIS — F1520 Other stimulant dependence, uncomplicated: Secondary | ICD-10-CM | POA: Diagnosis not present

## 2023-02-05 DIAGNOSIS — F1021 Alcohol dependence, in remission: Secondary | ICD-10-CM | POA: Diagnosis not present

## 2023-02-05 DIAGNOSIS — F1120 Opioid dependence, uncomplicated: Secondary | ICD-10-CM

## 2023-02-05 DIAGNOSIS — J069 Acute upper respiratory infection, unspecified: Secondary | ICD-10-CM | POA: Diagnosis not present

## 2023-02-05 NOTE — Progress Notes (Signed)
Daily Group Progress Note  Program: CD IOP   Group Time: 9:00 am-11:00 am  Type of Therapy: Process and Psychoeducational   Topic: The therapists check in with group members, assess for SI/HI/psychosis and overall level of functioning. The therapists inquire about sobriety date and number of community support meetings attended since last session.   The therapists facilitate discussions on how to cope with grief in a healthy manner while in recovery and on ways to reduce stress via practicing self-care by lightening one's schedule, exercise, meditation, turning off televisions and other noise so as to allow one's nervous system to calm down from overstimulation, etcetera. The therapists answer questions from group members concerning the purpose of GenSight testing. The therapists talk about testing for the MTHFR mutation for persons with depression.       Summary: Margaret Alvarado rates her depression as a "0" and her anxiety as an "8".  Margaret Alvarado reports her emotions today are "determined and anxious".  Margaret Alvarado reports she has a new sobriety date, that being 01-28-23.   Therapist inquires as to what led to the relapse.  Margaret Alvarado shares that Sunday before last she went grocery shopping  and noted "my people" live close.  Margaret Alvarado says she went to the grocery store and then went to use.  Therapists asks what Dott will do when this happens again. Margaret Alvarado says she realizes she needs to get a temporary sponsor so she will have someone to call.   Margaret Alvarado discusses the stress she feels under with her father-in-law being in the end stages of COPD and being in Palliative Care.  Margaret Alvarado says her father-in-law homes schools her 64 yr old son and though she says he knows, she has not discussed this situation with him.  Margaret Alvarado says she is going to call [facility]osp[facility] about grief counseling.  Margaret Alvarado shares that she has had several family members/close friends that have passed away in the last 10 years and this has been quite  difficult for her. She responds to therapists intervention of discussing self-care, stating she feels guilty when she engages in self-care.    Progress Towards Goals: New sobriety date: 01/28/23  UDS collected: yes  Results: Yes. Results from 01-22-23: positive for alcohol metabolites, Amphetamines and Methamphetamine.  Results from 01-29-23:  Positive for Amphetamines and Methamphetamines  AA/NA attended?: Yes  Sponsor?: No  Remigio Eisenmenger, MS, LMFT, [address] [place], [place], [place], [facility], [place] 08/042024

## 2023-02-07 ENCOUNTER — Telehealth (HOSPITAL_COMMUNITY): Payer: Self-pay | Admitting: Licensed Clinical Social Worker

## 2023-02-07 ENCOUNTER — Encounter (HOSPITAL_COMMUNITY): Payer: Self-pay

## 2023-02-07 ENCOUNTER — Ambulatory Visit (HOSPITAL_COMMUNITY): Payer: No Typology Code available for payment source

## 2023-02-07 NOTE — Telephone Encounter (Signed)
The therapist receives a voicemail from [place] saying that she will not be in group today as she woke up sick and has a fever.  [name], MA, LCSW, [facility], LCAS 02/07/2023

## 2023-02-09 ENCOUNTER — Telehealth (HOSPITAL_COMMUNITY): Payer: Self-pay

## 2023-02-09 ENCOUNTER — Ambulatory Visit (INDEPENDENT_AMBULATORY_CARE_PROVIDER_SITE_OTHER): Payer: No Typology Code available for payment source

## 2023-02-09 DIAGNOSIS — F4310 Post-traumatic stress disorder, unspecified: Secondary | ICD-10-CM

## 2023-02-09 DIAGNOSIS — F1520 Other stimulant dependence, uncomplicated: Secondary | ICD-10-CM

## 2023-02-09 DIAGNOSIS — F1120 Opioid dependence, uncomplicated: Secondary | ICD-10-CM

## 2023-02-09 DIAGNOSIS — F1020 Alcohol dependence, uncomplicated: Secondary | ICD-10-CM | POA: Diagnosis not present

## 2023-02-09 MED ORDER — FLUTICASONE PROPIONATE 50 MCG/ACT NA SUSP: 2.0000 | Freq: Every day | NASAL | 0 refills | Status: DC

## 2023-02-09 MED ORDER — BENZONATATE 100 MG PO CAPS: 100.0000 mg | ORAL_CAPSULE | Freq: Three times a day (TID) | ORAL | 0 refills | Status: DC | PRN

## 2023-02-09 NOTE — Telephone Encounter (Signed)
Note in error.

## 2023-02-09 NOTE — Progress Notes (Signed)
Daily Group Progress Note  Program: CD IOP   Group Time: 9:00 am-11:00 am  Type of Therapy: Process and Psychoeducational   Topic: The therapists check in with group members, assess for SI/HI/psychosis and overall level of functioning. The therapists inquire about sobriety date and number of community support meetings attended since last session.   Therapists observe that people relapse often claiming that they did so for no particular reason; however, therapists illustrate how this is not true per the fundamental principle of behavior therapy; "all behavior is purposeful." Therapists explain that people in active addiction often use substances to get rid of what they perceive to be "bad" emotions; however, therapists debunk the myth of bad emotions pointing out that that emotions are neither good or bad nor are they right or wrong but that emotions are like indicators or guide posts telling a person what to do. For example, anxiety tells people that they need to slow down or lighten their load while depression tells them that they are lacking enough pleasurable activities or positive social interactions.  Therapists illustrate how addiction is a disease that impacts the entire family system explaining family systems theory and why family therapy is a necessary part of addiction treatment. Therapists also discuss how one's psychiatric and/or substance use diagnosis can be used as a weapon by dysfunctional family members trying to silence them. Lastly, therapists discuss the importance of "coming out" as an alcoholic or addict and how this connects to taking the First Step of the Twelve Steps.      Summary: Munirah rates her depression as a "0" and her anxiety as an "8". She identifies her emotions as "anxious and determined". Reanna says she got a couple of numbers for temporary sponsors at the meeting. Ursela explains that her family had a meeting regarding her father-in-law's being terminal and in  his last days.  Kellieann says her 9 year old son took it a lot better than she thought he would.  She said her son indicated he already knew what was going on. Mida says she did not give him enough credit.  She said he didn't cry.  Therapist points out he may have already been grieving.  Lekendra says she feels grateful that her father-in-law was able to home school the son and that they became so close. She points out that her son is smart as he is 37 yrs old and only has 8 credit left for graduation. Annikka says she and her husband are pretty much empty nesters now and she is going to a meeting today and focus on getting a sponsor.  Dezarai reports her take away from today's group is that she realizes she needs to work on trust issues.    Progress Towards Goals: no use of alcohol or drugs  UDS collected: yes  Results: alcohol metabolites, amphetamines, methamphetamines  AA/NA attended?: Yes  Sponsor?: No  Remigio Eisenmenger, MS, LMFT, [address] [place], [place], LCSW, [facility], LCAS 02/09/2023

## 2023-02-09 NOTE — Progress Notes (Signed)
E-Visit for Upper Respiratory Infection   We are sorry you are not feeling well.  Here is how we plan to help!  Based on what you have shared with me, it looks like you may have a viral upper respiratory infection.  Upper respiratory infections are caused by a large number of viruses; however, rhinovirus is the most common cause.   Symptoms vary from person to person, with common symptoms including sore throat, cough, fatigue or lack of energy and feeling of general discomfort.  A low-grade fever of up to 100.4 may present, but is often uncommon.  Symptoms vary however, and are closely related to a person's age or underlying illnesses.  The most common symptoms associated with an upper respiratory infection are nasal discharge or congestion, cough, sneezing, headache and pressure in the ears and face.  These symptoms usually persist for about 3 to 10 days, but can last up to 2 weeks.  It is important to know that upper respiratory infections do not cause serious illness or complications in most cases.    Upper respiratory infections can be transmitted from person to person, with the most common method of transmission being a person's hands.  The virus is able to live on the skin and can infect other persons for up to 2 hours after direct contact.  Also, these can be transmitted when someone coughs or sneezes; thus, it is important to cover the mouth to reduce this risk.  To keep the spread of the illness at bay, good hand hygiene is very important.  This is an infection that is most likely caused by a virus. There are no specific treatments other than to help you with the symptoms until the infection runs its course.  We are sorry you are not feeling well.  Here is how we plan to help!   For nasal congestion, you may use an oral decongestants such as Mucinex D or if you have glaucoma or high blood pressure use plain Mucinex.  Saline nasal spray or nasal drops can help and can safely be used as often as  needed for congestion.  For your congestion, I have prescribed Fluticasone nasal spray one spray in each nostril twice a day  If you do not have a history of heart disease, hypertension, diabetes or thyroid disease, prostate/bladder issues or glaucoma, you may also use Sudafed to treat nasal congestion.  It is highly recommended that you consult with a pharmacist or your primary care physician to ensure this medication is safe for you to take.     If you have a cough, you may use cough suppressants such as Delsym and Robitussin.  If you have glaucoma or high blood pressure, you can also use Coricidin HBP.   For cough I have prescribed for you A prescription cough medication called Tessalon Perles 100 mg. You may take 1-2 capsules every 8 hours as needed for cough  If you have a sore or scratchy throat, use a saltwater gargle-  to  teaspoon of salt dissolved in a 4-ounce to 8-ounce glass of warm water.  Gargle the solution for approximately 15-30 seconds and then spit.  It is important not to swallow the solution.  You can also use throat lozenges/cough drops and Chloraseptic spray to help with throat pain or discomfort.  Warm or cold liquids can also be helpful in relieving throat pain.  For headache, pain or general discomfort, you can use Ibuprofen or Tylenol as directed.   Some authorities believe  that zinc sprays or the use of Echinacea may shorten the course of your symptoms.  It would be recommended to consider testing for Covid 19 as your symptoms could be consistent and we have seen an  increase in cases in the community.   HOME CARE Only take medications as instructed by your medical team. Be sure to drink plenty of fluids. Water is fine as well as fruit juices, sodas and electrolyte beverages. You may want to stay away from caffeine or alcohol. If you are nauseated, try taking small sips of liquids. How do you know if you are getting enough fluid? Your urine should be a pale yellow or  almost colorless. Get rest. Taking a steamy shower or using a humidifier may help nasal congestion and ease sore throat pain. You can place a towel over your head and breathe in the steam from hot water coming from a faucet. Using a saline nasal spray works much the same way. Cough drops, hard candies and sore throat lozenges may ease your cough. Avoid close contacts especially the very young and the elderly Cover your mouth if you cough or sneeze Always remember to wash your hands.   GET HELP RIGHT AWAY IF: You develop worsening fever. If your symptoms do not improve within 10 days You develop yellow or green discharge from your nose over 3 days. You have coughing fits You develop a severe head ache or visual changes. You develop shortness of breath, difficulty breathing or start having chest pain Your symptoms persist after you have completed your treatment plan  MAKE SURE YOU  Understand these instructions. Will watch your condition. Will get help right away if you are not doing well or get worse.  Thank you for choosing an e-visit.  Your e-visit answers were reviewed by a board certified advanced clinical practitioner to complete your personal care plan. Depending upon the condition, your plan could have included both over the counter or prescription medications.  Please review your pharmacy choice. Make sure the pharmacy is open so you can pick up prescription now. If there is a problem, you may contact your provider through [employer] and have the prescription routed to another pharmacy.  Your safety is important to [place]. If you have drug allergies check your prescription carefully.   For the next 24 hours you can use MyChart to ask questions about today's visit, request a non-urgent call back, or ask for a work or school excuse. You will get an email in the next two days asking about your experience. I hope that your e-visit has been valuable and will speed your  recovery.   I have spent 5 minutes in review of e-visit questionnaire, review and updating patient chart, medical decision making and response to patient.   Margaretann Loveless, PA-C

## 2023-02-09 NOTE — Telephone Encounter (Signed)
Faxed forms to [employer].

## 2023-02-12 ENCOUNTER — Encounter (HOSPITAL_COMMUNITY): Payer: Self-pay | Admitting: Licensed Clinical Social Worker

## 2023-02-12 ENCOUNTER — Ambulatory Visit (HOSPITAL_COMMUNITY): Payer: No Typology Code available for payment source

## 2023-02-12 ENCOUNTER — Telehealth (INDEPENDENT_AMBULATORY_CARE_PROVIDER_SITE_OTHER): Payer: No Typology Code available for payment source | Admitting: Licensed Clinical Social Worker

## 2023-02-12 ENCOUNTER — Encounter (HOSPITAL_COMMUNITY): Payer: Self-pay

## 2023-02-12 DIAGNOSIS — F1120 Opioid dependence, uncomplicated: Secondary | ICD-10-CM

## 2023-02-12 DIAGNOSIS — F1994 Other psychoactive substance use, unspecified with psychoactive substance-induced mood disorder: Secondary | ICD-10-CM

## 2023-02-12 DIAGNOSIS — F1520 Other stimulant dependence, uncomplicated: Secondary | ICD-10-CM

## 2023-02-12 DIAGNOSIS — F19980 Other psychoactive substance use, unspecified with psychoactive substance-induced anxiety disorder: Secondary | ICD-10-CM

## 2023-02-12 DIAGNOSIS — F4310 Post-traumatic stress disorder, unspecified: Secondary | ICD-10-CM

## 2023-02-12 DIAGNOSIS — F989 Unspecified behavioral and emotional disorders with onset usually occurring in childhood and adolescence: Secondary | ICD-10-CM

## 2023-02-12 DIAGNOSIS — Z6372 Alcoholism and drug addiction in family: Secondary | ICD-10-CM

## 2023-02-12 DIAGNOSIS — Z8659 Personal history of other mental and behavioral disorders: Secondary | ICD-10-CM

## 2023-02-12 DIAGNOSIS — Z91199 Patient's noncompliance with other medical treatment and regimen due to unspecified reason: Secondary | ICD-10-CM

## 2023-02-12 DIAGNOSIS — F17200 Nicotine dependence, unspecified, uncomplicated: Secondary | ICD-10-CM

## 2023-02-12 DIAGNOSIS — K029 Dental caries, unspecified: Secondary | ICD-10-CM

## 2023-02-12 DIAGNOSIS — F1021 Alcohol dependence, in remission: Secondary | ICD-10-CM

## 2023-02-12 DIAGNOSIS — F1920 Other psychoactive substance dependence, uncomplicated: Secondary | ICD-10-CM

## 2023-02-12 DIAGNOSIS — F1020 Alcohol dependence, uncomplicated: Secondary | ICD-10-CM

## 2023-02-12 NOTE — Telephone Encounter (Signed)
The therapist receives the following email from [facility]:  "Good morning. I am scheduled for admission at the [facility] tomorrow 8/13. The weekend did not go as planned. I came to the realization last night that I am unable to do this without completely removing myself from [facility] and getting some help getting past that 48 hour mark that seems to keep throwing me backward. Is there anything you guys need from me before i leave? And can i rejoin your group when I return?   Dortha Schwalbe"   The therapist responds to [facility] via a letter in My Chart.   [name], MA, LCSW, [facility], LCAS 02/12/2023

## 2023-02-12 NOTE — Addendum Note (Signed)
Addended by: Court Joy on: 02/12/2023 11:14 AM   Modules accepted: Level of Service

## 2023-02-12 NOTE — Telephone Encounter (Signed)
[facility]                                                                                Discharge Summary   Date of Admission: 01/24/2023 [place] Source:  [place]illy [place] APRN at [facility]                                                                        Date of Discharge: 02/12/2023 Sobriety Date: Unable to abstain past 48 hrs Admission Diagnosis:  ICD-10-CM      1. Methamphetamine addiction (HCC)  F15.20       2. Alcohol use disorder, severe, dependence (HCC)  F10.20       3. Polysubstance dependence including opioid drug with daily use (HCC)  F11.20      F19.20      Methamphetamine;Cocaine;MDMA;Mushrooms;Marijuana;Tobacco Prescription Ritalin and Xanax     4. Tobacco use disorder  F17.200       5. Dysfunctional family due to alcoholism  Z63.72       6. 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 PTSD (post-traumatic stress disorder)  F43.10       8. Substance induced mood disorder (HCC)  F19.94       9. 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 History of ADHD  Z86.59      Pt states she "talked Doctor into prescribing amphetamines and then Xanax to deal with abuse" Never properly evaluated     11. Dental caries  K02.9      Drug use related      Course of Treatment: Patient unable to abstain past 48 hrs. Called Counselor: Garrot, Regino Bellow  Social Worker CASE MANAGEMENT   Telephone Encounter Signed   Encounter Date: 02/12/2023   Signed      The therapist receives the following email from [facility]:   "Good morning. I am scheduled for admission at the [facility] tomorrow 8/13. The weekend did not go as planned. I came to the realization last night that I am unable to do this without completely removing myself from [facility] and getting some help getting past that 48 hour mark that seems to keep  throwing me backward. Is there anything you guys need from me before i leave? And can i rejoin your group when I return?    Dortha Schwalbe"         Medications:      []   albuterol (VENTOLIN HFA) 108 (90 Base) MCG/ACT inhaler Inhale 2 puffs into the lungs every 6 (six) hours as needed for wheezing or shortness of breath. Dispense: 8 g, Refills: 0 ordered   03/02/2022 -- Waldon Merl, PA-C         []   baclofen (LIORESAL) 10 MG tablet Take 1 tablet (10 mg total) by mouth 3 (three) times  daily. Dispense: 90 tablet, Refills: 1 ordered   01/24/2023 01/24/2024 Court Joy, PA-C         []   benzonatate (TESSALON) 100 MG capsule Take 1 capsule (100 mg total) by mouth 3 (three) times daily as needed. Dispense: 30 capsule, Refills: 0 ordered   02/09/2023 -- Margaretann Loveless, PA-C         []   Buprenorphine HCl-Naloxone HCl 4-1 MG FILM Dissolve 1 film under the tongue 2 times daily. **Max daily dose: 2 films** Dispense: 28 each, Refills: 0 of 0 remaining   11/29/2021 -- --         []   buPROPion (WELLBUTRIN XL) 150 MG 24 hr tablet Take 1 tablet (150 mg total) by mouth every morning. Dispense: 30 tablet, Refills: 1 of 1 remaining   08/20/2022 -- Mozingo, Thereasa Solo, NP         []   busPIRone (BUSPAR) 10 MG tablet Take 1 tablet (10 mg total) by mouth 3 (three) times daily. Dispense: 90 tablet, Refills: 2 ordered   01/24/2023 04/24/2023 Court Joy, PA-C         []   EPINEPHrine 0.3 mg/0.3 mL IJ SOAJ injection Inject 0.3 mg into the muscle as needed for anaphylaxis. Dispense: 1 each, Refills: 0 ordered   12/28/2022 -- Pollyann Savoy, MD         []   fluticasone (FLONASE) 50 MCG/ACT nasal spray Place 2 sprays into both nostrils daily. Dispense: 16 g, Refills: 0 ordered   02/09/2023 -- Margaretann Loveless, PA-C         []   methylphenidate (RITALIN) 20 MG tablet Take 1 tablet (20 mg) by mouth every morning and afternoon for ADHD Dispense: 60 tablet, Refills: 0 of 0 remaining    11/18/2022 -- --         []   methylphenidate (RITALIN) 20 MG tablet Take 1 tablet (20 mg) by mouth every morning and afternoon for ADHD Dispense: 60 tablet, Refills: 0 of 0 remaining   12/18/2022 -- --         []   methylphenidate (RITALIN) 20 MG tablet Take 1 tablet (20 mg) by mouth every morning and afternoon for ADHD Dispense: 60 tablet, Refills: 0 of 0 remaining   10/20/2022 -- --         []   mupirocin ointment (BACTROBAN) 2 % Apply 1 Application topically 2 (two) times daily. Dispense: 22 g, Refills: 0 of 0 remaining   03/19/2022 -- Claiborne Rigg, NP         []   oxyCODONE-acetaminophen (PERCOCET/ROXICET) 5-325 MG tablet Take 1 tablet by mouth every 6 (six) hours as needed for severe pain. Dispense: 15 tablet, Refills: 0 ordered   08/15/2022 -- Smoot, Shawn Route, PA-C         []   sertraline (ZOLOFT) 100 MG tablet Take 1 tablet (100 mg total) by mouth daily. Dispense: 30 tablet, Refills: 1 of 2 remaining   07/31/2022 -- Mozingo, Thereasa Solo, NP         []   sertraline (ZOLOFT) 100 MG tablet               Discharge Diagnosis:  Unable to comply with   Treatment                         Z91.  199  SEE ADMISSION DIAGNOSES 1. Methamphetamine addiction (HCC)  F15.20       2. Alcohol use disorder, severe, dependence (HCC)  F10.20       3. Polysubstance dependence including opioid drug with daily use (HCC)  F11.20      F19.20      Methamphetamine;Cocaine;MDMA;Mushrooms;Marijuana;Tobacco Prescription Ritalin and Xanax     4. Tobacco use disorder  F17.200       5. Dysfunctional family due to alcoholism  Z63.72       6. 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 PTSD (post-traumatic stress disorder)  F43.10       8. Substance induced mood disorder (HCC)  F19.94       9. 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 History of ADHD  Z86.59      Pt states she "talked Doctor into prescribing amphetamines  and then Xanax to deal with abuse" Never properly evaluated     11. Dental caries  K02.9      Drug use related      Plan of Action to Address Continuing Problems:  Goals and Activities to Help Maintain Sobriety: Stay away from people ,places and things that are triggers Continue practicing Fair Fighting rules in interpersonal conflicts. Continue alcohol and drug refusal skills and call on support system  Attend AA/NA meetings AT LEAST as often as you use  Obtain a sponsor and a home group in AA/NA. 6.   Enter Higher Level of Care (Residential)  Referrals: Pt self referred to [facility] treatment center Aftercare:Return to [facility] Medication management:na Other:PDMP Xanax April  Next appointment: To be scheduled post treatment  Prognosis:?    Client has NOT participated in the development of this discharge plan and may receive a copy of this completed plan

## 2023-02-13 DIAGNOSIS — F10230 Alcohol dependence with withdrawal, uncomplicated: Secondary | ICD-10-CM | POA: Diagnosis not present

## 2023-02-13 DIAGNOSIS — F329 Major depressive disorder, single episode, unspecified: Secondary | ICD-10-CM | POA: Diagnosis not present

## 2023-02-13 DIAGNOSIS — F1520 Other stimulant dependence, uncomplicated: Secondary | ICD-10-CM | POA: Diagnosis not present

## 2023-02-13 DIAGNOSIS — F17200 Nicotine dependence, unspecified, uncomplicated: Secondary | ICD-10-CM | POA: Diagnosis not present

## 2023-02-13 DIAGNOSIS — F4310 Post-traumatic stress disorder, unspecified: Secondary | ICD-10-CM | POA: Diagnosis not present

## 2023-02-13 DIAGNOSIS — F419 Anxiety disorder, unspecified: Secondary | ICD-10-CM | POA: Diagnosis not present

## 2023-02-13 DIAGNOSIS — F1020 Alcohol dependence, uncomplicated: Secondary | ICD-10-CM | POA: Diagnosis not present

## 2023-02-14 ENCOUNTER — Ambulatory Visit (HOSPITAL_COMMUNITY): Payer: No Typology Code available for payment source

## 2023-02-16 ENCOUNTER — Ambulatory Visit (HOSPITAL_COMMUNITY): Payer: No Typology Code available for payment source

## 2023-02-19 ENCOUNTER — Ambulatory Visit (HOSPITAL_COMMUNITY): Payer: No Typology Code available for payment source

## 2023-02-20 ENCOUNTER — Telehealth (HOSPITAL_COMMUNITY): Payer: Self-pay | Admitting: Licensed Clinical Social Worker

## 2023-02-20 DIAGNOSIS — F10230 Alcohol dependence with withdrawal, uncomplicated: Secondary | ICD-10-CM | POA: Diagnosis not present

## 2023-02-20 DIAGNOSIS — F17200 Nicotine dependence, unspecified, uncomplicated: Secondary | ICD-10-CM | POA: Diagnosis not present

## 2023-02-20 DIAGNOSIS — F329 Major depressive disorder, single episode, unspecified: Secondary | ICD-10-CM | POA: Diagnosis not present

## 2023-02-20 DIAGNOSIS — F1020 Alcohol dependence, uncomplicated: Secondary | ICD-10-CM | POA: Diagnosis not present

## 2023-02-20 DIAGNOSIS — F419 Anxiety disorder, unspecified: Secondary | ICD-10-CM | POA: Diagnosis not present

## 2023-02-20 DIAGNOSIS — F4310 Post-traumatic stress disorder, unspecified: Secondary | ICD-10-CM | POA: Diagnosis not present

## 2023-02-20 DIAGNOSIS — F1520 Other stimulant dependence, uncomplicated: Secondary | ICD-10-CM | POA: Diagnosis not present

## 2023-02-20 NOTE — Telephone Encounter (Signed)
The therapist returns a call from [place] with the [facility] leaving a HIPAA-compliant voicemail in response to her request for addition information. He informs her that her member, "[name]" with the DOB of 12-27-1985 is no longer in [facility] program but went to inpatient substance use residential on 02/13/23.   He leaves his direct callback number should she be in need of additional information.  Margaret Blazer, MA, LCSW, [facility], LCAS 02/20/2023

## 2023-02-20 NOTE — Telephone Encounter (Signed)
The therapist returns another call from [place] with the [facility] leaving a HIPAA-compliant voicemail letting her know that this patient went to the [employer]. As the therapist and this Representative have been missing each other, he provides his fax number for her to send her questions and his email for secure, emails.  [name], MA, LCSW, [facility], LCAS 02/20/2023

## 2023-02-21 ENCOUNTER — Ambulatory Visit (HOSPITAL_COMMUNITY): Payer: No Typology Code available for payment source

## 2023-02-23 ENCOUNTER — Ambulatory Visit (HOSPITAL_COMMUNITY): Payer: No Typology Code available for payment source

## 2023-02-26 ENCOUNTER — Ambulatory Visit (HOSPITAL_COMMUNITY): Payer: No Typology Code available for payment source

## 2023-02-28 ENCOUNTER — Ambulatory Visit (HOSPITAL_COMMUNITY): Payer: No Typology Code available for payment source

## 2023-03-02 ENCOUNTER — Ambulatory Visit (HOSPITAL_COMMUNITY): Payer: No Typology Code available for payment source

## 2023-03-02 DIAGNOSIS — F1520 Other stimulant dependence, uncomplicated: Secondary | ICD-10-CM | POA: Diagnosis not present

## 2023-03-02 DIAGNOSIS — F1020 Alcohol dependence, uncomplicated: Secondary | ICD-10-CM | POA: Diagnosis not present

## 2023-03-02 DIAGNOSIS — F10230 Alcohol dependence with withdrawal, uncomplicated: Secondary | ICD-10-CM | POA: Diagnosis not present

## 2023-03-02 DIAGNOSIS — F329 Major depressive disorder, single episode, unspecified: Secondary | ICD-10-CM | POA: Diagnosis not present

## 2023-03-02 DIAGNOSIS — F17200 Nicotine dependence, unspecified, uncomplicated: Secondary | ICD-10-CM | POA: Diagnosis not present

## 2023-03-02 DIAGNOSIS — F419 Anxiety disorder, unspecified: Secondary | ICD-10-CM | POA: Diagnosis not present

## 2023-03-02 DIAGNOSIS — F4310 Post-traumatic stress disorder, unspecified: Secondary | ICD-10-CM | POA: Diagnosis not present

## 2023-03-06 ENCOUNTER — Telehealth (HOSPITAL_COMMUNITY): Payer: Self-pay | Admitting: Licensed Clinical Social Worker

## 2023-03-06 NOTE — Telephone Encounter (Signed)
The therapist receives the following email from [place]:  "That is incredible news. I will you you at 9 in the morning!   Anniebelle Lapier[address] [place], LCSW, [facility], LCAS 03/06/2023

## 2023-03-06 NOTE — Telephone Encounter (Signed)
The therapist receives the following email from [place]:  "Hi! I have finished the residential part of treatment and would like to get back on the schedule as soon as possible."  The therapist sends a separate email with the following:  "Good morning,   You can start back tomorrow, 03/07/23, if you would like. Please call me at [phone] if you have any questions or concerns.  Thanks,"   [name], MA, LCSW, [facility], LCAS 03/06/2023

## 2023-03-07 ENCOUNTER — Ambulatory Visit (HOSPITAL_COMMUNITY): Payer: No Typology Code available for payment source

## 2023-03-07 ENCOUNTER — Encounter (HOSPITAL_COMMUNITY): Payer: Self-pay

## 2023-03-07 ENCOUNTER — Telehealth (HOSPITAL_COMMUNITY): Payer: Self-pay

## 2023-03-07 NOTE — Telephone Encounter (Signed)
This therapist emails Margaret Alvarado back to say thank you and missed you. Hurley again responds to say she had an appointment at 8am and it ran longer than she thought it would

## 2023-03-09 ENCOUNTER — Ambulatory Visit (INDEPENDENT_AMBULATORY_CARE_PROVIDER_SITE_OTHER): Payer: No Typology Code available for payment source

## 2023-03-09 DIAGNOSIS — F1520 Other stimulant dependence, uncomplicated: Secondary | ICD-10-CM

## 2023-03-09 DIAGNOSIS — F1020 Alcohol dependence, uncomplicated: Secondary | ICD-10-CM | POA: Diagnosis not present

## 2023-03-09 DIAGNOSIS — F4310 Post-traumatic stress disorder, unspecified: Secondary | ICD-10-CM

## 2023-03-09 DIAGNOSIS — F1120 Opioid dependence, uncomplicated: Secondary | ICD-10-CM

## 2023-03-09 NOTE — Progress Notes (Addendum)
Daily Group Progress Note  Program: [facility] IOP   Group Time: 9:00 am-11:00 am  Type of Therapy: Process and Psychoeducational   Topic: The therapists check in with group members, assess for SI/HI/psychosis and overall level of functioning. The therapists inquire about sobriety date and number of community support meetings attended since last session.   The therapists discuss how certain substance withdrawal syndromes can be fatal and discuss the role of Narcan in preventing death from opioid overdose. The therapists facilitate a discussion concerning why people keep going back to toxic relationships that they know they should not be in. The therapists educate group members on irrational guilt as it related to bereavement. Lastly, the therapists emphasize the fact that people in early recovery must organize their lives around treatment-related activities such as Twelve Step meetings and not organize Twelve Step meetings around their personal lives.     Summary: Brave rates her depression as a "0 and her anxiety as an "2". She identifies her emotions as "determined and helpful".  Kinnley says her new sobriety date that being February 13, 2023.Margaret Alvarado  [place] shares that she has had a rough past few days with chaos in the house due to construction projects.  Keedra says she is trying to remind herself to deal with one thing as a time. Keonda shares she is working on setting boundaries with her families.Margaret Alvarado Fiyinfoluwa shares she is going to [employer] and has a temporary sponsor    Progress Towards Goals: no use of alcohol or drugs  UDS collected: No  Results: none  AA/NA attended?: Yes  Sponsor?: No  Remigio Eisenmenger, MS, LMFT, [address] [place], [place], LCSW, [facility], LCAS 03/09/23

## 2023-03-12 ENCOUNTER — Encounter (HOSPITAL_COMMUNITY): Payer: Self-pay | Admitting: Medical

## 2023-03-12 ENCOUNTER — Ambulatory Visit (INDEPENDENT_AMBULATORY_CARE_PROVIDER_SITE_OTHER): Payer: No Typology Code available for payment source | Admitting: Medical

## 2023-03-12 DIAGNOSIS — F1520 Other stimulant dependence, uncomplicated: Secondary | ICD-10-CM

## 2023-03-12 DIAGNOSIS — F1924 Other psychoactive substance dependence with psychoactive substance-induced mood disorder: Secondary | ICD-10-CM

## 2023-03-12 DIAGNOSIS — F4310 Post-traumatic stress disorder, unspecified: Secondary | ICD-10-CM

## 2023-03-12 DIAGNOSIS — F1120 Opioid dependence, uncomplicated: Secondary | ICD-10-CM | POA: Diagnosis not present

## 2023-03-12 DIAGNOSIS — F1994 Other psychoactive substance use, unspecified with psychoactive substance-induced mood disorder: Secondary | ICD-10-CM

## 2023-03-12 DIAGNOSIS — F19280 Other psychoactive substance dependence with psychoactive substance-induced anxiety disorder: Secondary | ICD-10-CM | POA: Diagnosis not present

## 2023-03-12 DIAGNOSIS — F19980 Other psychoactive substance use, unspecified with psychoactive substance-induced anxiety disorder: Secondary | ICD-10-CM

## 2023-03-12 DIAGNOSIS — F989 Unspecified behavioral and emotional disorders with onset usually occurring in childhood and adolescence: Secondary | ICD-10-CM | POA: Diagnosis not present

## 2023-03-12 DIAGNOSIS — F17200 Nicotine dependence, unspecified, uncomplicated: Secondary | ICD-10-CM | POA: Diagnosis not present

## 2023-03-12 DIAGNOSIS — Z91199 Patient's noncompliance with other medical treatment and regimen due to unspecified reason: Secondary | ICD-10-CM | POA: Diagnosis not present

## 2023-03-12 DIAGNOSIS — F1020 Alcohol dependence, uncomplicated: Secondary | ICD-10-CM

## 2023-03-12 DIAGNOSIS — Z6372 Alcoholism and drug addiction in family: Secondary | ICD-10-CM

## 2023-03-12 MED ORDER — NALTREXONE HCL 50 MG PO TABS: 50.0000 mg | ORAL_TABLET | Freq: Every day | ORAL | 2 refills | Status: AC

## 2023-03-12 NOTE — Progress Notes (Signed)
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 been separated from her DOCs and is not obsessing s/p withdrawal. Has managed triggers so far  Treatment Plan:Treatment Plan/Recommendations:  Plan of Care: SUDs/Core issues [facility] CDIOP see Counselor's individualized treatment plan  Laboratory:  UDS per Protocol  Psychotherapy: CD IOP Group,Individual and Family  Medications: See list  Routine PRN Medications:  NA  Consultations: No  Safety Concerns: RISK ASSESSMENT -Negative  Other:  Anatomy and Biology of addiction reviewed with Google (Pictures of  Pet Scans Of Addicted Brains) and You Tube (Baclofen reduces cravings)   Margaret Morn, PA-C

## 2023-03-12 NOTE — Progress Notes (Signed)
Daily Group Progress Note  Program: CD IOP   Group Time: 9:00 am-11:00 am  Type of Therapy: Process and Psychoeducational   Topic: The therapists check in with group members, assess for SI/HI/psychosis and overall level of functioning. The therapists inquire about sobriety date and number of community support meetings attended since last session.    Therapists pass out the [employer] reading on "Rebellion".  Therapists facilitate discussion on Rebellion as it relates to addiction and early recovery through elaborating on the definition (stuck in self will), providing and eliciting examples of rebellion in this context. Therapists discuss how people who suffer from addiction may seek information from a therapist, sponsor or others with years in recovery on how to get better,however may refuse to follow through.Therapists discuss the importance of addicts to learn to let go and become humble when the information being shared from someone who has experienced success because the person in early recovery has not experienced that level of success. Therapists discuss when rebellion can be an asset against the disease of addiction.  Therapist present examples of this including "don't listen to your disease" because if you do what your disease is telling you, then you will not experience any degree of recovery. Therapists discuss the necessity of being "selfish" in recovery, that is making recovery one's number one priority.   Summary: Kabella rates her depression as a "0 and her anxiety as an "2". Charlott responds to [employer]. reading on "Rebellion" by saying that she can relate to this as she has always tried to prove people wrong.  Tyshonda says she has been rebellious all her life.  Beva shares she had a rough day yesterday with all the chaos at her home.  She elaborates that the chaos involves her husband and her son.  She says her husband is her biggest trigger. She says for the 30 years they have been  married this is the first time they have both been sober. Joreen says drugs were always the coping mechanism.  Nourah says there are two large dogs that live in crates and her husband do not think they need to be outside of the crates.  Zafirah says she knows why her son behaves the way he does as she said when she was in active addiction, she would give him anything he asked for to appease him. Therapist notes that the theme is of enabling and disaster.  Therapist discusses how she and her husband can decide together how to parent the son, and to not allow him to be in their presence when they are having such parental conversations.    Progress Towards Goals: no use of alcohol or drugs  UDS collected: Yes Results: none  AA/NA attended?: Yes  Sponsor?: Temporary Sponsor  Remigio Eisenmenger, MS, LMFT, [address] [place], [place], LCSW, [facility], LCAS 03/12/23

## 2023-03-14 ENCOUNTER — Ambulatory Visit (INDEPENDENT_AMBULATORY_CARE_PROVIDER_SITE_OTHER): Payer: No Typology Code available for payment source

## 2023-03-14 ENCOUNTER — Telehealth (HOSPITAL_COMMUNITY): Payer: Self-pay

## 2023-03-14 DIAGNOSIS — F1020 Alcohol dependence, uncomplicated: Secondary | ICD-10-CM | POA: Diagnosis not present

## 2023-03-14 DIAGNOSIS — F1520 Other stimulant dependence, uncomplicated: Secondary | ICD-10-CM | POA: Diagnosis not present

## 2023-03-14 DIAGNOSIS — F1120 Opioid dependence, uncomplicated: Secondary | ICD-10-CM | POA: Diagnosis not present

## 2023-03-14 DIAGNOSIS — F4310 Post-traumatic stress disorder, unspecified: Secondary | ICD-10-CM

## 2023-03-14 NOTE — Progress Notes (Unsigned)
Daily Group Progress Note   Program: CD IOP     Family Time: 1 p.m. to 2:20 p.m.   Type of Therapy: Process and Psychoeducational   The therapist meets with Margaret Alvarado and her husband and discusses their lack of self-care and their concerns about their son. Margaret Alvarado's PHQ-9 score today is a 14 and GAD-7 is a 15. The therapist learns that Margaret Alvarado has not been taking her Pristiq but after addressing her resistant to this, she agrees to start taking it daily. Also, the therapist advises her to stop taking Tylenol PM for sleep as she has no pain and recommends taking her prescribed Vistaril instead. The therapist addresses Margaret Alvarado's exacerbating her anxiety by catastrophizing. The therapist recommends that Margaret Alvarado and her husband sit down and talk to their son about the new expectations and discusses how to parent from the "we" position. The therapist recommends that they get their son back into counseling and start by supervising him when doing chores at first to assure he finishes them and get him back on a normal sleep schedule as these are their main concerns.  Margaret Alvarado's husband asks to take the PHQ-9 and GAD-7 with both scores also being in the moderate range. As he has been on the same psychotropic medications for quite a number of months, he will take the PHQ-9 and GAD-7 to his upcoming med appointment in the next week or so. He is advised to get in with a PCP to get his physical and have his blood pressure meds adjusted if needed to address his poorly controlled hypertension. He is also strongly encouraged to get back on a C-Pap and stay diligent in working with the company until he is fitted with a mask that works for him.   They are both encouraged to simply focus on completing the one or two most important things on their to do list on any day. They will be seeing a marital therapist next week.  Margaret Blazer, MA, LCSW, [facility], LCAS 03/14/2023

## 2023-03-14 NOTE — Telephone Encounter (Signed)
[facility] sent the following email early this morning:  Good morning! My son has an appointment at 8am in [place] that I was reminded of last night. I should ,however, make it for at least the 2nd half of group today. I wanted to let you know just incase. I also went ahead and attached the paperwork to have my leave of absence extended for at least a couple of weeks if possible. I am  not ready for that chaos just yet. HR at [employer] did advise me yesterday that a Dr's note with the extended dates would suffice as well. I am currently scheduled to return to work September 16th and I feel like I need alittle more time to peace myself back together completely.    *I changed my number before I left treatment: [phone].   Thanks! Dortha Schwalbe

## 2023-03-14 NOTE — Progress Notes (Unsigned)
Daily Group Progress Note  Program: CD IOP   Group Time: 10:30 am-12:00 pm  Type of Therapy: Process and Psychoeducational   Topic: The therapists check in with group members, assess for SI/HI/psychosis and overall level of functioning. The therapists inquire about sobriety date and number of community support meetings attended since last session.   Therapists discuss and prompt discussions on the following topics today: the issue of patients getting mis-diagnosed when a full history is not obtained, for instance mental health diagnoses being assigned when a full addiction history does not delineate whether or not the mental health symptoms occurred when an individual is in sobriety or if they are being given such diagnoses when they are in active addiction; Discussed the importance of understanding the family member's perception of their loved one's disease of addiction and issues that have arisen as a result of being in active action.  Reframed loved one's "nagging" the person in active addiction to a caring stance. Discussed the importance of looking at what needs to happen once the person is in recovery. Discussed "rolling with addiction" as a helpful concept to emulate when one is dealing with family or friends that continue to bring up behaviors that were done when one is in active addiction; Discussed readiness to change, including the reasons to stop using, both negative and positive.    Summary: Lauryl rates her depression as a "0 and her anxiety as an "3". She identifies her emotions as "Happy and Grateful". She reports the same sobriety date.    Rida discusses how her family functioned around her when she was in active addiction.  She shares she is picking up her 30 day chip tonight. Tyleen shares that she and her husband both agree she is "shit" with communication. Zuzanna shares her husband is trying to save her. Therapist asks from what and Keeley responds "from herself".  She says  he is afraid of her driving. Therapist asks if her husband is trying to manage her disease and Kessley says she is.  Aalani says her husband had a tracking device on her phone which she tried to delete. Mayley said in response to her doing that, her husband had a "tantrum".  Leberta says she and her husband are going to marital therapy.    Progress Towards Goals: no use of alcohol or drugs  UDS collected: No  Results: negative  AA/NA attended?: no  Sponsor?: No  Remigio Eisenmenger, MS, LMFT, [address] [place], [place], [place], [employer] [facility], LCAS 03/14/23

## 2023-03-16 ENCOUNTER — Telehealth (HOSPITAL_COMMUNITY): Payer: Self-pay | Admitting: Licensed Clinical Social Worker

## 2023-03-16 ENCOUNTER — Ambulatory Visit (HOSPITAL_COMMUNITY): Payer: No Typology Code available for payment source

## 2023-03-16 ENCOUNTER — Encounter (HOSPITAL_COMMUNITY): Payer: Self-pay

## 2023-03-16 ENCOUNTER — Encounter (HOSPITAL_COMMUNITY): Payer: Self-pay | Admitting: Licensed Clinical Social Worker

## 2023-03-16 NOTE — Telephone Encounter (Signed)
The therapist attempts to reach [facility] when she no show no calls for group but is unable to leave a HIPAA-compliant message as her voicemail is full. Thus, the therapist sends a letter to her via MyChart.  [name], MA, LCSW, [facility], LCAS 03/16/2023

## 2023-03-19 ENCOUNTER — Telehealth (HOSPITAL_COMMUNITY): Payer: Self-pay | Admitting: Licensed Clinical Social Worker

## 2023-03-19 ENCOUNTER — Encounter: Payer: No Typology Code available for payment source | Admitting: Plastic Surgery

## 2023-03-19 ENCOUNTER — Ambulatory Visit (HOSPITAL_COMMUNITY): Payer: No Typology Code available for payment source

## 2023-03-19 DIAGNOSIS — F1120 Opioid dependence, uncomplicated: Secondary | ICD-10-CM | POA: Diagnosis not present

## 2023-03-19 DIAGNOSIS — F1020 Alcohol dependence, uncomplicated: Secondary | ICD-10-CM | POA: Diagnosis not present

## 2023-03-19 DIAGNOSIS — F1510 Other stimulant abuse, uncomplicated: Secondary | ICD-10-CM | POA: Diagnosis not present

## 2023-03-19 NOTE — Telephone Encounter (Signed)
The therapist receives the following email from [facility]:  "Good morning. I seen the letter you sent Friday evening. Friday was a challenging day, I woke up with a terrible toothache. I was also without a phone most of the  day. I have a drs appointment at 8:30 to get some blood work done. I had some kind of reaction to the antibiotics for my tooth. Can i come.after and sign that note if its after class?   Margaret Alvarado"  The therapist sends a new email to "[email or web address]" with the following:  "Good Morning,  Margaret Alvarado and I will be free after 1 p.m. if you would like to come in and sign the paperwork.  Thanks,"  [name], MA, LCSW, [facility], LCAS 03/19/2023

## 2023-03-20 ENCOUNTER — Telehealth (HOSPITAL_COMMUNITY): Payer: Self-pay | Admitting: Licensed Clinical Social Worker

## 2023-03-20 DIAGNOSIS — Z630 Problems in relationship with spouse or partner: Secondary | ICD-10-CM | POA: Diagnosis not present

## 2023-03-20 DIAGNOSIS — F4323 Adjustment disorder with mixed anxiety and depressed mood: Secondary | ICD-10-CM | POA: Diagnosis not present

## 2023-03-20 NOTE — Telephone Encounter (Signed)
The therapist faxes the PA-C's letter extending her leave of absence to [employer] per Cheyene's request.  [name], MA, LCSW, [facility], LCAS 03/20/2023

## 2023-03-20 NOTE — Telephone Encounter (Signed)
The therapist returns Margaret Alvarado's call confirming her identity via two identifiers. She is checking to see if the letter was sent to her employer as they told her they had no received it. The therapist confirms that he faxed the letter immediately after she signed the ROI yesterday using the fax cover sheet provided which appears to have a bar code on it. The therapist informs her that he can resend if needed.  [name], MA, LCSW, [facility], LCAS 03/20/2023

## 2023-03-21 ENCOUNTER — Ambulatory Visit (INDEPENDENT_AMBULATORY_CARE_PROVIDER_SITE_OTHER): Payer: No Typology Code available for payment source

## 2023-03-21 DIAGNOSIS — F1020 Alcohol dependence, uncomplicated: Secondary | ICD-10-CM

## 2023-03-21 DIAGNOSIS — F4310 Post-traumatic stress disorder, unspecified: Secondary | ICD-10-CM

## 2023-03-21 DIAGNOSIS — F1120 Opioid dependence, uncomplicated: Secondary | ICD-10-CM

## 2023-03-21 DIAGNOSIS — F1520 Other stimulant dependence, uncomplicated: Secondary | ICD-10-CM | POA: Diagnosis not present

## 2023-03-21 NOTE — Progress Notes (Signed)
Daily Group Progress Note  Program: CD IOP   Group Time: 9:50 am-12:00 pm  Type of Therapy: Process and Psychoeducational   Topic: The therapists check in with group members, assess for SI/HI/psychosis and overall level of functioning. The therapists inquire about sobriety date and number of community support meetings attended since last session.   herapists explain what the DSM, and how to use to criteria to determine if one has a substance use disorder and the degree of the severity.  Therapists discussed the areas that are evaluated to explore the impact the use of substance has had on the areas: 1) Impaired control, 2) Social Problems. 3) Risky use, 4) Physical Dependence Therapists had each person to identify what criteria they met. The severity levels of mild, moderate and severe were discussed.  Therapists prompts discussion on the groups understanding of the diagnosis. Therapists present the ASAM criteria and review how specific criteria indicate different levels of care. Therapists discuss how medical treatments vary based on the severity of the disease or condition and liken it to DSM diagnoses, particularly Substance Use.  Therapists discussed how the most aggressive treatments are required for conditions with high severity. Therapists inquire of the group if they are able to see this connection and evaluate what treatments and assistance they are seeking given the severity of their disease, specifically the number of community meetings they are attending.  Therapists provide the example if one had advance stages of cancer the medical team would not be recommending the exact treatment as they would for someone with a less serious condition.    Summary: Margaret Alvarado rates her depression as a "2" and her anxiety as an "3". She identifies her emotions as "determined and anxious".    In response to therapist asking what take aways she got from today, Margaret Alvarado shares that she had found herself backing  off meetings but was told in residential treatment that this approach would not produce a positive outcome.  Margaret Alvarado says if she did not have a sponsor, she would be out on "a run".  Margaret Alvarado shares she has a new sobriety date of 03-18-23    Progress Towards Goals: new sobriety date of 03-18-23  UDS collected: No  Results: none  AA/NA attended?: yes  Sponsor?: yes  Remigio Eisenmenger, MS, LMFT, [address] [place], [place], LCSW, [facility], LCAS 03/21/23

## 2023-03-23 ENCOUNTER — Encounter (HOSPITAL_COMMUNITY): Payer: Self-pay

## 2023-03-23 ENCOUNTER — Ambulatory Visit (HOSPITAL_COMMUNITY): Payer: No Typology Code available for payment source

## 2023-03-23 ENCOUNTER — Telehealth (HOSPITAL_COMMUNITY): Payer: Self-pay | Admitting: Licensed Clinical Social Worker

## 2023-03-23 DIAGNOSIS — Z630 Problems in relationship with spouse or partner: Secondary | ICD-10-CM | POA: Diagnosis not present

## 2023-03-23 DIAGNOSIS — F4323 Adjustment disorder with mixed anxiety and depressed mood: Secondary | ICD-10-CM | POA: Diagnosis not present

## 2023-03-23 NOTE — Telephone Encounter (Signed)
The therapist attempts to reach [facility] via phone in response to her no show today leaving a HIPAA-compliant voicemail.   [name], MA, LCSW, [facility], LCAS 03/23/2023

## 2023-03-25 ENCOUNTER — Telehealth: Payer: No Typology Code available for payment source | Admitting: Family Medicine

## 2023-03-25 DIAGNOSIS — K047 Periapical abscess without sinus: Secondary | ICD-10-CM | POA: Diagnosis not present

## 2023-03-26 ENCOUNTER — Telehealth (HOSPITAL_COMMUNITY): Payer: Self-pay | Admitting: Licensed Clinical Social Worker

## 2023-03-26 ENCOUNTER — Ambulatory Visit (HOSPITAL_COMMUNITY): Payer: No Typology Code available for payment source

## 2023-03-26 DIAGNOSIS — F4310 Post-traumatic stress disorder, unspecified: Secondary | ICD-10-CM

## 2023-03-26 DIAGNOSIS — F1020 Alcohol dependence, uncomplicated: Secondary | ICD-10-CM

## 2023-03-26 DIAGNOSIS — F1120 Opioid dependence, uncomplicated: Secondary | ICD-10-CM

## 2023-03-26 DIAGNOSIS — F1520 Other stimulant dependence, uncomplicated: Secondary | ICD-10-CM

## 2023-03-26 MED ORDER — NAPROXEN 500 MG PO TABS: 500.0000 mg | ORAL_TABLET | Freq: Two times a day (BID) | ORAL | 0 refills | Status: AC

## 2023-03-26 MED ORDER — CLINDAMYCIN HCL 300 MG PO CAPS: 300.0000 mg | ORAL_CAPSULE | Freq: Three times a day (TID) | ORAL | 0 refills | Status: AC

## 2023-03-26 NOTE — Progress Notes (Signed)

## 2023-03-26 NOTE — Telephone Encounter (Signed)
The therapist receives the following email from [facility]:  "I apologize I did not send this earlier. I am really struggling with some pretty rough dental issues. I slept away the morning today waiting for my dentist appointment this afternoon. I will be in group Monday and hopefully have this dental pain behind me by then.   Adlemi Skipwith[address] [place], LCSW, [facility], LCAS 03/26/2023

## 2023-03-26 NOTE — Progress Notes (Signed)
Daily Group Progress Note  Program: CD IOP   Group Time: 9:00 am-12:00 pm  Type of Therapy: Process and Psychoeducational   Topic: The therapists check in with group members, assess for SI/HI/psychosis and overall level of functioning. The therapists inquire about sobriety date and number of community support meetings attended since last session.    Therapists pass out the N.A. reading, "Keeping the Gift". Therapists discuss viewing recovery as a gift and how gifts are precious. Therapists discuss how people who value themselves want to do things that are healthy and how if a person hates themselves, they then participate in activities and practices that are self destructive. Therapists discuss epigenetics, focusing on the impact of intergenerational trauma and addiction.  Therapists discuss the importance of after care with integrated treatment. Therapists also discuss the importance of having a sponsor that has availability that persons in early recovery need.  Summary: Margaret Alvarado rates her depression as a 2" and her anxiety as a "6".  She identifies her emotions as "determined and anxious".  She shares she has attended meetings.  Margaret Alvarado reports that when something good happens, she is always looking for "the other shoe to drop".   She discusses her slip before 03-18-23 and says her disease was telling her that she could tolerate drinking. She says a peer that she went through residential treatment called and asked her to come to [place] to help her move out from her boyfriend.  Margaret Alvarado says this peer was drinking and that was the trigger. Margaret Alvarado says she realizes that this was participating in self-destruction. Margaret Alvarado says she thinks some of her anxiety is from taking prednisone for her mouth swelling. Margaret Alvarado says she has to go back to have 4 bottom teeth extracted so she will not be in CD-IOP on Wednesday.  Progress Towards Goals: no use since 03-18-23  UDS collected: No  Results:  none  AA/NA attended?: yes  Sponsor?: yes  Remigio Eisenmenger, MS, LMFT, [address] [place], [place], [place], [facility], LCAS 03/26/23

## 2023-03-26 NOTE — Telephone Encounter (Signed)
The therapist receives a voicemail from [place] saying that she is returning this therapist's call from Friday and confirming that she will be in group today.  [name], MA, LCSW, [facility], LCAS 03/26/2023

## 2023-03-28 ENCOUNTER — Ambulatory Visit (HOSPITAL_COMMUNITY): Payer: No Typology Code available for payment source

## 2023-03-30 ENCOUNTER — Telehealth (HOSPITAL_COMMUNITY): Payer: Self-pay

## 2023-03-30 ENCOUNTER — Encounter (HOSPITAL_COMMUNITY): Payer: Self-pay

## 2023-03-30 ENCOUNTER — Ambulatory Visit (HOSPITAL_COMMUNITY): Payer: No Typology Code available for payment source

## 2023-03-30 NOTE — Telephone Encounter (Signed)
Therapist calls Panda in efforts to outreach her as she did not show up for group today. Therapist reached VM and left a HIPAA compliant VM requesting a return call.  Remigio Eisenmenger, MS, LMFT, LCAS  03-30-23

## 2023-04-02 ENCOUNTER — Encounter (HOSPITAL_COMMUNITY): Payer: Self-pay | Admitting: Medical

## 2023-04-02 ENCOUNTER — Ambulatory Visit (INDEPENDENT_AMBULATORY_CARE_PROVIDER_SITE_OTHER): Payer: No Typology Code available for payment source | Admitting: Medical

## 2023-04-02 ENCOUNTER — Telehealth (HOSPITAL_COMMUNITY): Payer: Self-pay | Admitting: Licensed Clinical Social Worker

## 2023-04-02 DIAGNOSIS — Z6372 Alcoholism and drug addiction in family: Secondary | ICD-10-CM

## 2023-04-02 DIAGNOSIS — F17200 Nicotine dependence, unspecified, uncomplicated: Secondary | ICD-10-CM

## 2023-04-02 DIAGNOSIS — F1020 Alcohol dependence, uncomplicated: Secondary | ICD-10-CM | POA: Diagnosis not present

## 2023-04-02 DIAGNOSIS — F19980 Other psychoactive substance use, unspecified with psychoactive substance-induced anxiety disorder: Secondary | ICD-10-CM

## 2023-04-02 DIAGNOSIS — K029 Dental caries, unspecified: Secondary | ICD-10-CM

## 2023-04-02 DIAGNOSIS — F1520 Other stimulant dependence, uncomplicated: Secondary | ICD-10-CM

## 2023-04-02 DIAGNOSIS — Z8659 Personal history of other mental and behavioral disorders: Secondary | ICD-10-CM

## 2023-04-02 DIAGNOSIS — F1994 Other psychoactive substance use, unspecified with psychoactive substance-induced mood disorder: Secondary | ICD-10-CM

## 2023-04-02 DIAGNOSIS — F989 Unspecified behavioral and emotional disorders with onset usually occurring in childhood and adolescence: Secondary | ICD-10-CM

## 2023-04-02 DIAGNOSIS — F4310 Post-traumatic stress disorder, unspecified: Secondary | ICD-10-CM

## 2023-04-02 DIAGNOSIS — F1121 Opioid dependence, in remission: Secondary | ICD-10-CM

## 2023-04-02 NOTE — Telephone Encounter (Signed)
The therapist receives the following email from [facility] this morning:  "Good morning. I am at the gastro Dr with my daughter Yvonna Alanis. The appointment should not be too long. We are in [place] so unfortunately I will be late. I will be in group, just wanted to let you know. I apologize, my kids need me to advocate for them when it comes to appointments like this one.   Shanequa [address], [place], LCSW, [facility], LCAS 04/02/2023

## 2023-04-02 NOTE — Progress Notes (Signed)
Daily Group Progress Note  Program: CD IOP   Group Time: 10:15 am-12:00 pm Individual Time: 12:00 pm to 1:15 pm   Type of Therapy: Process and Psychoeducational   Topic: The therapists check in with group members, assess for SI/HI/psychosis and overall level of functioning. The therapists inquire about sobriety date and number of community support meetings attended since last session.   Therapists presented a video on Post Acute Withdrawal Syndrome.  Therapists discuss symptoms common during the Post Acute Withdrawal Syndrome and prompt discussion for any questions group members may have.   Therapists present the [employer] and discuss stages of the progression of the disease of addiction. Therapists discussion meditation and how it is helpful with quieting the chatter in one's mind  including chatter that is often negative.    Summary: Hollin rates her depression as a "4" and her anxiety as an "5". Lettie says she was not able to get her 4 lower teeth extracted last week due to the hole in her mouth that was created from her retainer. She reports the same sobriety date. Alycia says she is has not spoken to her sponsor for a couple of days but plans to see her at an in person meeting tonight. Jashae says she has been doing Sales promotion account executive. Malai shares how worried she is about the devastation that occurred in the [facility]. Carmaleta says she has a difficult time trying to calm herself. Katheline shares that her take away today is that she has learned she needs to stay off the internet this week looking [place] news about [place]. Therapists point out how emergency transportation is getting supplies to that part of the state and how worrying does not help them or herself.    Progress Towards Goals: same sobriety date  UDS collected: yes  Results: none  AA/NA attended?: yes  Sponsor?: yes. Will meet with sponsor in person tonight  Remigio Eisenmenger, MS, LMFT, LCAS [address]  [place], [place], LCSW, [facility], LCAS 04/02/2023   Individual Time: 12:00 pm to 1:15 pm                        Topics: Therapist inquired about Mignonne's status since she and her husband last met with the therapist. Therapist explores where the idea of [place]'s need to constantly be busy may be coming from and when Lujean elaborates on her family of origin, therapist discusses the fear Demitria may have of becoming like her father so she is not able to relax.  Summary:  In response to the therapist asking Mercedes's status since she and her husband last met with the therapist, she says they have presented an united front to their son. Karrin says she and her husband have linked up to an online marital therapist, through [facility].  Robert says the therapist has already met with her husband and will be meeting with her to get to know her. Terri discusses how her husband is laid back and is opposite of her.  Shevon says she sees multiple things that need to be done at the house.  She discusses how she gets so focused on what needs to be done that she is never able to relax. When therapist asks about her family of origin, Pavneet says her family of origin is so different from her. Shima says her father used to lay around drunk and never repaired anything that needed to be repaired in the house.  Seymone agrees with the therapist about her fear of becoming her father and how this motivates her continued anxiety and "monkey mind". Amana says she understand there will always be something to do but still feels consumed about the devastation in [place].  Zunairah does agree to turn off social media and not listen to the news.  [name], LCSW, [facility], LCAS Remigio Eisenmenger, LMFT, LCAS
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
## 2023-04-03 DIAGNOSIS — F4323 Adjustment disorder with mixed anxiety and depressed mood: Secondary | ICD-10-CM | POA: Diagnosis not present

## 2023-04-03 DIAGNOSIS — Z630 Problems in relationship with spouse or partner: Secondary | ICD-10-CM | POA: Diagnosis not present

## 2023-04-04 ENCOUNTER — Ambulatory Visit (INDEPENDENT_AMBULATORY_CARE_PROVIDER_SITE_OTHER): Payer: No Typology Code available for payment source

## 2023-04-04 ENCOUNTER — Telehealth (HOSPITAL_COMMUNITY): Payer: Self-pay

## 2023-04-04 DIAGNOSIS — F1520 Other stimulant dependence, uncomplicated: Secondary | ICD-10-CM

## 2023-04-04 DIAGNOSIS — F1020 Alcohol dependence, uncomplicated: Secondary | ICD-10-CM

## 2023-04-04 DIAGNOSIS — F1121 Opioid dependence, in remission: Secondary | ICD-10-CM | POA: Diagnosis not present

## 2023-04-04 NOTE — Progress Notes (Signed)
Daily Group Progress Note  Program: CD IOP   Group Time: 9:00  am-12:00 pm   Type of Therapy: Process and Psychoeducational   Topic: The therapists check in with group members, assess for SI/HI/psychosis and overall level of functioning. The therapists inquire about sobriety date and number of community support meetings attended since last session.   Therapists discuss the following topics today: how MAT can be helpful with substance Use Disorders, including the anti-craving effects and how they can be a safety net of not having to wrestle with the cravings; Shaming attacking exercises by Caleen Essex, as many people suffering from addiction struggle with addiction being due to character and moral flaws; showed Delbert Boone's video on the psychology of addiction and prompts discussion on how there is no difference between "hard" and "soft" drugs as many people think; discusses there is no "special" addicts, in order words someone is not better or stand apart because they use or do not use any particular drug and how addicts are "garden variety"; prompt discussion on what to look for in a sponsor.    Summary: Margaret Alvarado rates her depression as a "2" and her anxiety as an "4".  Margaret Alvarado identifies her emotions today as "determined and anxious".  Margaret Alvarado reports the same sobriety date. Margaret Alvarado says she is attending meetings.  Margaret Alvarado shares that she saw the marital therapist and the therapist recommends individual for each of them for the current time. Margaret Alvarado says she is frustrated that her husband doe not understand addiction as a medical disease, as he continue s to make statements such as "why do you want to through your clean time away and start using again".  Margaret Alvarado says because her husband had an addiction to prescription opioids, he thinks he is less severe in his addiction than she.  Margaret Alvarado says she and her husband argue a lot as Margaret Alvarado does not understand why he will start projects around the  house and then abandon them. Margaret Alvarado says she is not sure what she wants.  She says she is negotiating sobriety for the first time. Therapist encourages Margaret Alvarado to "put pen to paper" and identify what it is that she wants to see happen in her marriage, as Margaret Alvarado is  struggling to find peace.  Margaret Alvarado says she is talking to her sponsor about what is going on with her.  Margaret Alvarado says her take away from today is that she needs to "soul search" and figure out what she wants out of the marriage.  She says she is going to show Margaret Alvarado video to her children.  Margaret Alvarado shares that she will not be in group on Friday as she is going to have her four bottom teeth extracted.  Progress Towards Goals: same sobriety date  UDS collected: no  Results: none  AA/NA attended?: yes  Sponsor?: yes.   Remigio Eisenmenger, MS, LMFT, LCAS [address] [place], [place], [facility], LCAS 04/04/2023

## 2023-04-04 NOTE — Telephone Encounter (Signed)
Voice Mail from Monia Pouch was returned by this therapist.  Therapist left HIPAA compliant message and request from return call  Remigio Eisenmenger, MS, LMFT, LCAS 04-04-23

## 2023-04-06 ENCOUNTER — Encounter (HOSPITAL_COMMUNITY): Payer: Self-pay

## 2023-04-06 ENCOUNTER — Ambulatory Visit (HOSPITAL_COMMUNITY): Payer: No Typology Code available for payment source

## 2023-04-09 ENCOUNTER — Ambulatory Visit (HOSPITAL_COMMUNITY): Payer: No Typology Code available for payment source

## 2023-04-09 ENCOUNTER — Encounter (HOSPITAL_COMMUNITY): Payer: Self-pay

## 2023-04-09 ENCOUNTER — Telehealth (HOSPITAL_COMMUNITY): Payer: Self-pay | Admitting: Licensed Clinical Social Worker

## 2023-04-09 NOTE — Telephone Encounter (Signed)
The therapist receives the following email from [facility]:  "Good morning.  My intention was to send this before class, I apologize. Its been a long few days and I'm still pretty whipped out. I will see you guys Wednesday morning.    Marrion Finan[address], [place], LCSW, [facility], LCAS 04/09/2023

## 2023-04-10 ENCOUNTER — Emergency Department (HOSPITAL_BASED_OUTPATIENT_CLINIC_OR_DEPARTMENT_OTHER)
Admission: EM | Admit: 2023-04-10 | Discharge: 2023-04-10 | Discharge status: Against Medical Advice | Payer: No Typology Code available for payment source | Attending: Emergency Medicine | Admitting: Emergency Medicine

## 2023-04-10 ENCOUNTER — Encounter (HOSPITAL_BASED_OUTPATIENT_CLINIC_OR_DEPARTMENT_OTHER): Payer: Self-pay | Admitting: Emergency Medicine

## 2023-04-10 ENCOUNTER — Other Ambulatory Visit: Payer: Self-pay

## 2023-04-10 DIAGNOSIS — Z5321 Procedure and treatment not carried out due to patient leaving prior to being seen by health care provider: Secondary | ICD-10-CM | POA: Insufficient documentation

## 2023-04-10 DIAGNOSIS — N750 Cyst of Bartholin's gland: Secondary | ICD-10-CM | POA: Diagnosis not present

## 2023-04-10 NOTE — ED Triage Notes (Signed)
Pt to ER states she has a bartholin cyst that she first noticed approximately 4 days ago.  States has gotten worse in last 2 days.

## 2023-04-11 ENCOUNTER — Telehealth (HOSPITAL_COMMUNITY): Payer: Self-pay

## 2023-04-11 ENCOUNTER — Ambulatory Visit (HOSPITAL_COMMUNITY): Payer: No Typology Code available for payment source

## 2023-04-11 ENCOUNTER — Encounter (HOSPITAL_COMMUNITY): Payer: Self-pay

## 2023-04-11 DIAGNOSIS — N751 Abscess of Bartholin's gland: Secondary | ICD-10-CM | POA: Diagnosis not present

## 2023-04-12 ENCOUNTER — Telehealth (HOSPITAL_COMMUNITY): Payer: Self-pay | Admitting: Licensed Clinical Social Worker

## 2023-04-12 NOTE — Telephone Encounter (Signed)
The therapist receives the following email from [place]:  "Hi! I wanted to touch base with you now that I'm able too.   Life has been life'ing and has been very constant. Over the weekend I thought that I was getting sick because I had a low grade fever and just felt unwell. Turns out I had an abscess from a blocked bartholin gland. Things went from bad to worse quickly and I had to end up having a marsupialization surgery yesterday 10/9 winch was absolutely terrible. The surgery has put me pretty much out of commission today. My Dr advised me to rest today and tomorrow and I could go back to normal activities Saturday. I will not be back to group until Monday Oct 14th."   Bill Christinia [place], [place], [place], [facility], LCAS 04/12/2023

## 2023-04-13 ENCOUNTER — Ambulatory Visit (HOSPITAL_COMMUNITY): Payer: No Typology Code available for payment source

## 2023-04-16 ENCOUNTER — Ambulatory Visit (INDEPENDENT_AMBULATORY_CARE_PROVIDER_SITE_OTHER): Payer: Self-pay | Admitting: Medical

## 2023-04-16 ENCOUNTER — Telehealth (HOSPITAL_COMMUNITY): Payer: Self-pay

## 2023-04-16 ENCOUNTER — Encounter (HOSPITAL_COMMUNITY): Payer: Self-pay | Admitting: Medical

## 2023-04-16 DIAGNOSIS — F1520 Other stimulant dependence, uncomplicated: Secondary | ICD-10-CM

## 2023-04-16 DIAGNOSIS — Z6372 Alcoholism and drug addiction in family: Secondary | ICD-10-CM

## 2023-04-16 DIAGNOSIS — F17200 Nicotine dependence, unspecified, uncomplicated: Secondary | ICD-10-CM

## 2023-04-16 DIAGNOSIS — N750 Cyst of Bartholin's gland: Secondary | ICD-10-CM | POA: Diagnosis not present

## 2023-04-16 DIAGNOSIS — F4310 Post-traumatic stress disorder, unspecified: Secondary | ICD-10-CM

## 2023-04-16 DIAGNOSIS — F19980 Other psychoactive substance use, unspecified with psychoactive substance-induced anxiety disorder: Secondary | ICD-10-CM

## 2023-04-16 DIAGNOSIS — Z8659 Personal history of other mental and behavioral disorders: Secondary | ICD-10-CM

## 2023-04-16 DIAGNOSIS — F1121 Opioid dependence, in remission: Secondary | ICD-10-CM

## 2023-04-16 DIAGNOSIS — F1020 Alcohol dependence, uncomplicated: Secondary | ICD-10-CM

## 2023-04-16 DIAGNOSIS — Z91199 Patient's noncompliance with other medical treatment and regimen due to unspecified reason: Secondary | ICD-10-CM

## 2023-04-16 DIAGNOSIS — F1994 Other psychoactive substance use, unspecified with psychoactive substance-induced mood disorder: Secondary | ICD-10-CM

## 2023-04-16 DIAGNOSIS — F989 Unspecified behavioral and emotional disorders with onset usually occurring in childhood and adolescence: Secondary | ICD-10-CM

## 2023-04-16 DIAGNOSIS — K029 Dental caries, unspecified: Secondary | ICD-10-CM

## 2023-04-16 NOTE — Progress Notes (Signed)
Patient ID: Margaret Alvarado, female   DOB: 1985/12/23, 37 y.o.   MRN: [id number]  Patient was scheduled to return to IOP after Dental surgeries but failed to show up. Counselors have tried to contact and gotten no response I tried to call as well but had to leave VM  Counselor Garrot is out of office so don't if she left VM/Email with him

## 2023-04-17 ENCOUNTER — Telehealth (HOSPITAL_COMMUNITY): Payer: Self-pay | Admitting: Licensed Clinical Social Worker

## 2023-04-17 NOTE — Telephone Encounter (Signed)
The therapist receives the following email from [place] on 04/16/23 at 8:42 a.m.:  "Good morning. I am still unfortunately dealing with some complications from my recent procedure. I am healing alittle slower than I expected. I should be back to some kind of normal by Wednesday. Thank you for your understanding. This has been pretty tough.  Mikaylee Arseneau [address] [place], LCSW, [facility], LCAS 04/17/2023

## 2023-04-18 ENCOUNTER — Ambulatory Visit (INDEPENDENT_AMBULATORY_CARE_PROVIDER_SITE_OTHER): Payer: No Typology Code available for payment source | Admitting: Licensed Clinical Social Worker

## 2023-04-18 ENCOUNTER — Encounter (HOSPITAL_COMMUNITY): Payer: Self-pay

## 2023-04-18 DIAGNOSIS — F1121 Opioid dependence, in remission: Secondary | ICD-10-CM

## 2023-04-18 DIAGNOSIS — F4310 Post-traumatic stress disorder, unspecified: Secondary | ICD-10-CM

## 2023-04-18 DIAGNOSIS — F1520 Other stimulant dependence, uncomplicated: Secondary | ICD-10-CM | POA: Diagnosis not present

## 2023-04-18 DIAGNOSIS — F1020 Alcohol dependence, uncomplicated: Secondary | ICD-10-CM

## 2023-04-18 DIAGNOSIS — F17200 Nicotine dependence, unspecified, uncomplicated: Secondary | ICD-10-CM
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
## 2023-04-20 ENCOUNTER — Encounter (HOSPITAL_COMMUNITY): Payer: Self-pay | Admitting: Medical

## 2023-04-20 ENCOUNTER — Ambulatory Visit (HOSPITAL_COMMUNITY): Payer: No Typology Code available for payment source | Admitting: Medical

## 2023-04-20 ENCOUNTER — Telehealth (HOSPITAL_COMMUNITY): Payer: Self-pay | Admitting: Licensed Clinical Social Worker

## 2023-04-20 ENCOUNTER — Encounter (HOSPITAL_COMMUNITY): Payer: Self-pay

## 2023-04-20 DIAGNOSIS — F1994 Other psychoactive substance use, unspecified with psychoactive substance-induced mood disorder: Secondary | ICD-10-CM

## 2023-04-20 DIAGNOSIS — Z8659 Personal history of other mental and behavioral disorders: Secondary | ICD-10-CM

## 2023-04-20 DIAGNOSIS — F1520 Other stimulant dependence, uncomplicated: Secondary | ICD-10-CM

## 2023-04-20 DIAGNOSIS — F4310 Post-traumatic stress disorder, unspecified: Secondary | ICD-10-CM

## 2023-04-20 DIAGNOSIS — Z6372 Alcoholism and drug addiction in family: Secondary | ICD-10-CM

## 2023-04-20 DIAGNOSIS — F1121 Opioid dependence, in remission: Secondary | ICD-10-CM

## 2023-04-20 DIAGNOSIS — F1120 Opioid dependence, uncomplicated: Secondary | ICD-10-CM

## 2023-04-20 DIAGNOSIS — F989 Unspecified behavioral and emotional disorders with onset usually occurring in childhood and adolescence: Secondary | ICD-10-CM

## 2023-04-20 DIAGNOSIS — F1020 Alcohol dependence, uncomplicated: Secondary | ICD-10-CM | POA: Diagnosis not present

## 2023-04-20 DIAGNOSIS — Z91199 Patient's noncompliance with other medical treatment and regimen due to unspecified reason: Secondary | ICD-10-CM

## 2023-04-20 DIAGNOSIS — F19980 Other psychoactive substance use, unspecified with psychoactive substance-induced anxiety disorder: Secondary | ICD-10-CM

## 2023-04-20 DIAGNOSIS — K029 Dental caries, unspecified: Secondary | ICD-10-CM

## 2023-04-20 DIAGNOSIS — F17200 Nicotine dependence, unspecified, uncomplicated: Secondary | ICD-10-CM

## 2023-04-20 NOTE — Telephone Encounter (Signed)
The therapist receives the following email from [place]:  "Hi! So I went home yesterday and discussed the plan as far as piecing my past together. Advised Judie Grieve i would take a week break for my dental work. 20 mins later I bit into a slice of pizza,  broke of of the teeth that needs to be removed. The tooth came out. So I had the 4 teeth extracted today. I feel ok tonight but wanted to give a heads up incase that changes. I have started my timeline and should be good to go Monday god willing lol.   Thank you for your help,  Anaum Sgroi[address] [place], LCSW, [facility], LCAS 04/20/2023
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
## 2023-04-23 ENCOUNTER — Ambulatory Visit (INDEPENDENT_AMBULATORY_CARE_PROVIDER_SITE_OTHER): Payer: No Typology Code available for payment source

## 2023-04-23 DIAGNOSIS — F1020 Alcohol dependence, uncomplicated: Secondary | ICD-10-CM | POA: Diagnosis not present

## 2023-04-23 DIAGNOSIS — F1994 Other psychoactive substance use, unspecified with psychoactive substance-induced mood disorder: Secondary | ICD-10-CM

## 2023-04-23 DIAGNOSIS — F1121 Opioid dependence, in remission: Secondary | ICD-10-CM

## 2023-04-23 DIAGNOSIS — F4310 Post-traumatic stress disorder, unspecified: Secondary | ICD-10-CM

## 2023-04-23 DIAGNOSIS — F1520 Other stimulant dependence, uncomplicated: Secondary | ICD-10-CM | POA: Diagnosis not present

## 2023-04-23 DIAGNOSIS — F1120 Opioid dependence, uncomplicated: Secondary | ICD-10-CM | POA: Diagnosis not present

## 2023-04-23 NOTE — Progress Notes (Signed)
Daily Group Progress Note  Program: CD IOP   Group Time: 9:00  am-12:00 pm   Type of Therapy: Process and Psychoeducational   Topic: The therapists check in with group members, assess for SI/HI/psychosis and overall level of functioning. The therapists inquire about sobriety date and number of community support meetings attended since last session.   Therapists discuss the following issues in group: group members  to share their story of how they arrived in CD-IOP, .how using negative emotions can be a motivator to stay away from chemical use, identifying triggers, specially of anxiety and depression, addiction as a medical disease vs a character/will power disorder, CBT techniques of how to quiet the mind by staying in the moment/mindfulness  using diaphragmatic breathing, mantra's and grounding techniques, importance of making an adhering to  schedule.    Summary: Margaret Alvarado rates her depression as a "2" and her anxiety as an "5".  Margaret Alvarado identifies her emotions as anxious and determined.  Margaret Alvarado reports the same sobriety date (03-26-23).  She says she has been attending a couple of virtual meetings per week but needs to connect with her sponsor today. Margaret Alvarado says she wants to return to in person meetings.  Margaret Alvarado shares that she feels anxious about everything. She shares her story by saying she started out using when she was 37 years old.  Margaret Alvarado use continued and at age 30 she began having children. Margaret Alvarado says at age 13 she began using "hard" and the children's father went to prison. Margaret Alvarado says she met her current husband at age 74 and went from being independent being able to support herself and her children  to staying at home with 4 children.  Margaret Alvarado says she has struggled with homelessness with two babies prior to marrying her current husband. Margaret Alvarado says in 2016 she went to work part time.  In 2020, she obtained an associates degree and started a job at [employer]. Margaret Alvarado says around  that time she started using opioids.  Margaret Alvarado says prior to that her main drug was methamphetamines.  Margaret Alvarado says her MD began prescribing Ritalin and Xanex to her.  She increase her meth use to 3 gram per day and her husband told her he was not going to continue to live this way, as he had tracked her and found her at her dealers.  Her husband was in recovery and she decided to go to residential treatment.  Margaret Alvarado says she became exhausted, had to schedule or time management skills. Margaret Alvarado says that stimulants quiet her brain.  Margaret Alvarado says she is trying to create a narrative of her life, but has "holes" in it, however her mother and aunt did not help as they have difficult accounts of things that happened.    Progress Towards Goals: same sobriety date (03-26-23)  UDS collected: Yes  Results: none  AA/NA attended?: yes, virtual  Sponsor?: yes.   Remigio Eisenmenger, MS, LMFT, [address], [place], [place], [facility], LCAS 04/23/2023

## 2023-04-25 ENCOUNTER — Telehealth (HOSPITAL_COMMUNITY): Payer: Self-pay | Admitting: Licensed Clinical Social Worker

## 2023-04-25 ENCOUNTER — Ambulatory Visit (HOSPITAL_COMMUNITY): Payer: No Typology Code available for payment source

## 2023-04-25 ENCOUNTER — Encounter (HOSPITAL_COMMUNITY): Payer: Self-pay

## 2023-04-25 NOTE — Telephone Encounter (Signed)
Leonda sends the following email this morning:  "Morning. I overslept this morning. I however will be im group as soom as  Possible.  Emerii Escandon[address] [place], LCSW, [facility], LCAS 04/25/2023

## 2023-04-25 NOTE — Telephone Encounter (Signed)
The therapist receives the following email from [facility]:  "Hi again. Is there a way we can do another one on one tomorrow?  It was very beneficial for me. I need to discuss my return to work and what that may look like. I have been struggling this morning since I opened my eyes. I need to allow myself to just stay home and rest today.  Teyha Wallar[address], [place], LCSW, [facility], LCAS 04/25/2023

## 2023-04-25 NOTE — Telephone Encounter (Signed)
The therapist attempts to reach [facility] by phone in response to her email leaving a HIPAA-compliant voicemail asking that she call this therapist back regarding scheduling.   [name], MA, LCSW, [facility], LCAS 04/25/2023

## 2023-04-27 ENCOUNTER — Ambulatory Visit (INDEPENDENT_AMBULATORY_CARE_PROVIDER_SITE_OTHER): Payer: No Typology Code available for payment source | Admitting: Medical

## 2023-04-27 DIAGNOSIS — Z91199 Patient's noncompliance with other medical treatment and regimen due to unspecified reason: Secondary | ICD-10-CM

## 2023-04-27 DIAGNOSIS — Z8659 Personal history of other mental and behavioral disorders: Secondary | ICD-10-CM

## 2023-04-27 DIAGNOSIS — F4310 Post-traumatic stress disorder, unspecified: Secondary | ICD-10-CM

## 2023-04-27 DIAGNOSIS — F1120 Opioid dependence, uncomplicated: Secondary | ICD-10-CM

## 2023-04-27 DIAGNOSIS — F19980 Other psychoactive substance use, unspecified with psychoactive substance-induced anxiety disorder: Secondary | ICD-10-CM

## 2023-04-27 DIAGNOSIS — K029 Dental caries, unspecified: Secondary | ICD-10-CM

## 2023-04-27 DIAGNOSIS — F1121 Opioid dependence, in remission: Secondary | ICD-10-CM

## 2023-04-27 DIAGNOSIS — F1020 Alcohol dependence, uncomplicated: Secondary | ICD-10-CM

## 2023-04-27 DIAGNOSIS — F1520 Other stimulant dependence, uncomplicated: Secondary | ICD-10-CM

## 2023-04-27 DIAGNOSIS — F989 Unspecified behavioral and emotional disorders with onset usually occurring in childhood and adolescence: Secondary | ICD-10-CM

## 2023-04-27 DIAGNOSIS — F1920 Other psychoactive substance dependence, uncomplicated: Secondary | ICD-10-CM

## 2023-04-27 DIAGNOSIS — F17200 Nicotine dependence, unspecified, uncomplicated: Secondary | ICD-10-CM

## 2023-04-27 DIAGNOSIS — Z6372 Alcoholism and drug addiction in family: Secondary | ICD-10-CM

## 2023-04-27 DIAGNOSIS — F1994 Other psychoactive substance use, unspecified with psychoactive substance-induced mood disorder: Secondary | ICD-10-CM

## 2023-04-27 NOTE — Progress Notes (Signed)
Patient ID: Margaret Alvarado, female   DOB: 1985-09-21, 37 y.o.   MRN: [id number]  S&O- Been trying to FU with patient who has been absent for multiple groups since last Provider FU 9/30/2024when she returned from voluntary Inpt. Treatment that basically was a Detox. She was not interested in the Residential treatment there [facility].) She attended 1 group after on 04/04/2023 Since then her attendance has been erratic/sporadic claiming Dental surgery and "anxiety"as reasons for.She failed to return 04/16/23 as promised on 10/10  On 10/7 she called claiming fatigue. On 10/8 reported vaginal infection requiring I&D when she said she would return 10/14.10/15 she called citing"complications " from I&D to explainf her failure to appear 10/14. She did return 10/16 then missed groups until 10/21 claiming to have broken a tooth eating [employer].Counselor forgot to get a UDS due to therapy response by patient deemed to be reak progress in facing her CPTSD.She then failed to return until today . UDS was obtained 10/21 and was + for all her regularly abused substances except opiates

## 2023-04-27 NOTE — Progress Notes (Unsigned)
Patient ID: Margaret Alvarado, female   DOB: 13-Jul-1985, 37 y.o.   MRN: [id number]

## 2023-04-27 NOTE — Progress Notes (Unsigned)
   Daily Group Progress Note   Program: CD IOP     Group Time: 9:00  am-12:00 pm     Type of Therapy: Process and Psychoeducational    Topic: The therapists check in with group members, assess for SI/HI/psychosis and overall level of functioning. The therapists inquire about sobriety date and number of community support meetings attended since last session.    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     Summary: Margaret Alvarado  rates her depression today as a "3" and his anxiety as a "4". Margaret Alvarado identifies her emotions as "determined and hopeful".  Margaret Alvarado says she is returning to work next Thursday.  Margaret Alvarado reports having brain fog.  She says she needs a new sponsor, one that is going to check in on her.  Margaret Alvarado reports her sober date as late September. Margaret Alvarado. Therapist asks why Margaret Alvarado missed on Wednesday and she said she "held herself in the house".  She says, "I should have gone to sober living after being at [facility]". Therapist discuss Margaret Alvarado's sporadic attendance in group and therapist shares that a note to return to work could not be offered due to her sporadic attendance and the need for treatment and a higher level of care needed. Therapists discuss Margaret Alvarado's UDS results from the lab that were  positive for alcohol metabolites and methamphetamine. Margaret Alvarado does admit she drank last Sunday but maintains that she has not used drugs since shortly after being discharged from [facility].      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  Rapid UDS today:  positive for amphetamines and methamphetamine.   AA/NA attended?: yes   Sponsor?: says she needs a new one   Remigio Eisenmenger, MS, LMFT, LCAS [address] [place], [place], [place], [facility], LCAS 04-27-2023  Individual Session: 12:00pm to 12:35pm   Topics: A rapid UDS was done and Margaret Alvarado tests positive for Methamphetamines and Amphetamines.. Therapist encourages Margaret Alvarado to be honest, reminding her addiction is a brain disease.  Therapists discuss how a higher level of treatment is needed at this juncture.  Summary:  Margaret Alvarado maintains that she has not used these drugs since slipping shortly after being at [facility], thought her lab test showed positive for alcoholic metabolites and methamphetamines and her rapid UDS today tests positive for amphetamines and methamphetamines.  Margaret Alvarado says she has already called [facility] [employer] and realizes she cannot return to work at this juncture. Margaret Alvarado continues to say she has not used Meth since shortly after returning from [employer]. Margaret Alvarado becomes tearful and says she is going to call [facility] after she leaves today.  Remigio Eisenmenger, MS., LMFT, LCAS Alene Mires, MA, LCSW, [facility], LCAS 04-27-23

## 2023-04-30 ENCOUNTER — Telehealth (HOSPITAL_COMMUNITY): Payer: Self-pay | Admitting: Licensed Clinical Social Worker

## 2023-04-30 ENCOUNTER — Telehealth (INDEPENDENT_AMBULATORY_CARE_PROVIDER_SITE_OTHER): Payer: No Typology Code available for payment source | Admitting: Licensed Clinical Social Worker

## 2023-04-30 ENCOUNTER — Encounter (HOSPITAL_COMMUNITY): Payer: Self-pay | Admitting: Medical

## 2023-04-30 DIAGNOSIS — F1120 Opioid dependence, uncomplicated: Secondary | ICD-10-CM

## 2023-04-30 DIAGNOSIS — F1021 Alcohol dependence, in remission: Secondary | ICD-10-CM

## 2023-04-30 DIAGNOSIS — F39 Unspecified mood [affective] disorder: Secondary | ICD-10-CM

## 2023-04-30 DIAGNOSIS — F19980 Other psychoactive substance use, unspecified with psychoactive substance-induced anxiety disorder: Secondary | ICD-10-CM

## 2023-04-30 DIAGNOSIS — F1520 Other stimulant dependence, uncomplicated: Secondary | ICD-10-CM

## 2023-04-30 DIAGNOSIS — F1121 Opioid dependence, in remission: Secondary | ICD-10-CM

## 2023-04-30 DIAGNOSIS — Z6372 Alcoholism and drug addiction in family: Secondary | ICD-10-CM

## 2023-04-30 DIAGNOSIS — Z91199 Patient's noncompliance with other medical treatment and regimen due to unspecified reason: Secondary | ICD-10-CM

## 2023-04-30 DIAGNOSIS — F989 Unspecified behavioral and emotional disorders with onset usually occurring in childhood and adolescence: Secondary | ICD-10-CM

## 2023-04-30 DIAGNOSIS — F1020 Alcohol dependence, uncomplicated: Secondary | ICD-10-CM

## 2023-04-30 DIAGNOSIS — Z8659 Personal history of other mental and behavioral disorders: Secondary | ICD-10-CM

## 2023-04-30 DIAGNOSIS — K029 Dental caries, unspecified: Secondary | ICD-10-CM

## 2023-04-30 DIAGNOSIS — F1994 Other psychoactive substance use, unspecified with psychoactive substance-induced mood disorder: Secondary | ICD-10-CM

## 2023-04-30 DIAGNOSIS — F17200 Nicotine dependence, unspecified, uncomplicated: Secondary | ICD-10-CM

## 2023-04-30 DIAGNOSIS — F1920 Other psychoactive substance dependence, uncomplicated: Secondary | ICD-10-CM

## 2023-04-30 DIAGNOSIS — F4310 Post-traumatic stress disorder, unspecified: Secondary | ICD-10-CM

## 2023-04-30 NOTE — Telephone Encounter (Signed)
The therapist receives the following email from [facility]:  "Good morning. If you could please give me a call when you are available. I am scheduled to check into [facility] tomorrow morning at 10am. I do need your assistance regarding my fmla if possible. I will be available all day. [phone].   Thank you in advance,   Margaret Alvarado"  She leaves a voicemail later in the afternoon.  The therapist returns her call confirming her identity via two identifiers. She says that she is going to [facility] tomorrow and will probably see the provider on Wednesday but was told that he may not be able to get the FMLA paperwork in that day which is the last day.   Thus, she asks that this therapist fax another letter to [employer] in the interim such that she does not lose her job and can have the insurance to attend [name] saying that she only has to pay $300.  The therapist informs her that once he receives the emailed cover sheet from her today that a letter will be faxed before day's end.   Margaret Blazer, MA, LCSW, [facility], LCAS 04/30/2023

## 2023-05-02 DIAGNOSIS — F4310 Post-traumatic stress disorder, unspecified: Secondary | ICD-10-CM | POA: Diagnosis not present

## 2023-05-02 DIAGNOSIS — G4700 Insomnia, unspecified: Secondary | ICD-10-CM | POA: Diagnosis not present

## 2023-05-02 DIAGNOSIS — F329 Major depressive disorder, single episode, unspecified: Secondary | ICD-10-CM | POA: Diagnosis not present

## 2023-05-02 DIAGNOSIS — F17200 Nicotine dependence, unspecified, uncomplicated: Secondary | ICD-10-CM | POA: Diagnosis not present

## 2023-05-02 DIAGNOSIS — F1120 Opioid dependence, uncomplicated: Secondary | ICD-10-CM | POA: Diagnosis not present

## 2023-05-02 DIAGNOSIS — F1320 Sedative, hypnotic or anxiolytic dependence, uncomplicated: Secondary | ICD-10-CM | POA: Diagnosis not present

## 2023-05-02 DIAGNOSIS — Z681 Body mass index (BMI) 19 or less, adult: Secondary | ICD-10-CM | POA: Diagnosis not present

## 2023-05-02 DIAGNOSIS — F419 Anxiety disorder, unspecified: Secondary | ICD-10-CM | POA: Diagnosis not present

## 2023-05-02 DIAGNOSIS — F1520 Other stimulant dependence, uncomplicated: Secondary | ICD-10-CM | POA: Diagnosis not present

## 2023-05-02 DIAGNOSIS — R634 Abnormal weight loss: Secondary | ICD-10-CM | POA: Diagnosis not present

## 2023-05-02 DIAGNOSIS — F1020 Alcohol dependence, uncomplicated: Secondary | ICD-10-CM | POA: Diagnosis not present

## 2023-05-02 DIAGNOSIS — Z8616 Personal history of COVID-19: Secondary | ICD-10-CM | POA: Diagnosis not present

## 2023-05-03 NOTE — Addendum Note (Signed)
Addended by: Court Joy on: 05/03/2023 04:33 PM   Modules accepted: Level of Service

## 2023-05-04 DIAGNOSIS — F1520 Other stimulant dependence, uncomplicated: Secondary | ICD-10-CM | POA: Diagnosis not present

## 2023-05-04 DIAGNOSIS — F1020 Alcohol dependence, uncomplicated: Secondary | ICD-10-CM | POA: Diagnosis not present

## 2023-05-04 DIAGNOSIS — F419 Anxiety disorder, unspecified: Secondary | ICD-10-CM | POA: Diagnosis not present

## 2023-05-08 DIAGNOSIS — F419 Anxiety disorder, unspecified: Secondary | ICD-10-CM | POA: Diagnosis not present

## 2023-05-08 DIAGNOSIS — F1520 Other stimulant dependence, uncomplicated: Secondary | ICD-10-CM | POA: Diagnosis not present

## 2023-05-08 DIAGNOSIS — F1020 Alcohol dependence, uncomplicated: Secondary | ICD-10-CM | POA: Diagnosis not present

## 2023-05-08 DIAGNOSIS — R634 Abnormal weight loss: Secondary | ICD-10-CM | POA: Diagnosis not present

## 2023-05-15 DIAGNOSIS — F1520 Other stimulant dependence, uncomplicated: Secondary | ICD-10-CM | POA: Diagnosis not present

## 2023-05-24 DIAGNOSIS — F1520 Other stimulant dependence, uncomplicated: Secondary | ICD-10-CM | POA: Diagnosis not present

## 2023-05-25 DIAGNOSIS — F1520 Other stimulant dependence, uncomplicated: Secondary | ICD-10-CM | POA: Diagnosis not present

## 2023-06-04 DIAGNOSIS — F1120 Opioid dependence, uncomplicated: Secondary | ICD-10-CM | POA: Diagnosis not present

## 2023-06-04 DIAGNOSIS — F1320 Sedative, hypnotic or anxiolytic dependence, uncomplicated: Secondary | ICD-10-CM | POA: Diagnosis not present

## 2023-06-04 DIAGNOSIS — F329 Major depressive disorder, single episode, unspecified: Secondary | ICD-10-CM | POA: Diagnosis not present

## 2023-06-04 DIAGNOSIS — F17200 Nicotine dependence, unspecified, uncomplicated: Secondary | ICD-10-CM | POA: Diagnosis not present

## 2023-06-04 DIAGNOSIS — F419 Anxiety disorder, unspecified: Secondary | ICD-10-CM | POA: Diagnosis not present

## 2023-06-04 DIAGNOSIS — F1020 Alcohol dependence, uncomplicated: Secondary | ICD-10-CM | POA: Diagnosis not present

## 2023-06-04 DIAGNOSIS — F4310 Post-traumatic stress disorder, unspecified: Secondary | ICD-10-CM | POA: Diagnosis not present

## 2023-06-04 DIAGNOSIS — Z8616 Personal history of COVID-19: Secondary | ICD-10-CM | POA: Diagnosis not present

## 2023-06-04 DIAGNOSIS — R634 Abnormal weight loss: Secondary | ICD-10-CM | POA: Diagnosis not present

## 2023-06-04 DIAGNOSIS — Z681 Body mass index (BMI) 19 or less, adult: Secondary | ICD-10-CM | POA: Diagnosis not present

## 2023-06-04 DIAGNOSIS — G4700 Insomnia, unspecified: Secondary | ICD-10-CM | POA: Diagnosis not present

## 2023-06-04 DIAGNOSIS — F1520 Other stimulant dependence, uncomplicated: Secondary | ICD-10-CM | POA: Diagnosis not present

## 2023-06-05 DIAGNOSIS — F419 Anxiety disorder, unspecified: Secondary | ICD-10-CM | POA: Diagnosis not present

## 2023-06-05 DIAGNOSIS — Z8616 Personal history of COVID-19: Secondary | ICD-10-CM | POA: Diagnosis not present

## 2023-06-05 DIAGNOSIS — F1520 Other stimulant dependence, uncomplicated: Secondary | ICD-10-CM | POA: Diagnosis not present

## 2023-06-05 DIAGNOSIS — F17200 Nicotine dependence, unspecified, uncomplicated: Secondary | ICD-10-CM | POA: Diagnosis not present

## 2023-06-05 DIAGNOSIS — F329 Major depressive disorder, single episode, unspecified: Secondary | ICD-10-CM | POA: Diagnosis not present

## 2023-06-05 DIAGNOSIS — F4310 Post-traumatic stress disorder, unspecified: Secondary | ICD-10-CM | POA: Diagnosis not present

## 2023-06-05 DIAGNOSIS — F1320 Sedative, hypnotic or anxiolytic dependence, uncomplicated: Secondary | ICD-10-CM | POA: Diagnosis not present

## 2023-06-05 DIAGNOSIS — F1120 Opioid dependence, uncomplicated: Secondary | ICD-10-CM | POA: Diagnosis not present

## 2023-06-05 DIAGNOSIS — Z681 Body mass index (BMI) 19 or less, adult: Secondary | ICD-10-CM | POA: Diagnosis not present

## 2023-06-05 DIAGNOSIS — F1020 Alcohol dependence, uncomplicated: Secondary | ICD-10-CM | POA: Diagnosis not present

## 2023-06-05 DIAGNOSIS — G4700 Insomnia, unspecified: Secondary | ICD-10-CM | POA: Diagnosis not present

## 2023-06-05 DIAGNOSIS — R634 Abnormal weight loss: Secondary | ICD-10-CM | POA: Diagnosis not present

## 2023-06-06 DIAGNOSIS — F17200 Nicotine dependence, unspecified, uncomplicated: Secondary | ICD-10-CM | POA: Diagnosis not present

## 2023-06-06 DIAGNOSIS — F1120 Opioid dependence, uncomplicated: Secondary | ICD-10-CM | POA: Diagnosis not present

## 2023-06-06 DIAGNOSIS — F1020 Alcohol dependence, uncomplicated: Secondary | ICD-10-CM | POA: Diagnosis not present

## 2023-06-06 DIAGNOSIS — F4310 Post-traumatic stress disorder, unspecified: Secondary | ICD-10-CM | POA: Diagnosis not present

## 2023-06-06 DIAGNOSIS — F329 Major depressive disorder, single episode, unspecified: Secondary | ICD-10-CM | POA: Diagnosis not present

## 2023-06-06 DIAGNOSIS — G4700 Insomnia, unspecified: Secondary | ICD-10-CM | POA: Diagnosis not present

## 2023-06-06 DIAGNOSIS — Z8616 Personal history of COVID-19: Secondary | ICD-10-CM | POA: Diagnosis not present

## 2023-06-06 DIAGNOSIS — R634 Abnormal weight loss: Secondary | ICD-10-CM | POA: Diagnosis not present

## 2023-06-06 DIAGNOSIS — F419 Anxiety disorder, unspecified: Secondary | ICD-10-CM | POA: Diagnosis not present

## 2023-06-06 DIAGNOSIS — F1320 Sedative, hypnotic or anxiolytic dependence, uncomplicated: Secondary | ICD-10-CM | POA: Diagnosis not present

## 2023-06-06 DIAGNOSIS — Z681 Body mass index (BMI) 19 or less, adult: Secondary | ICD-10-CM | POA: Diagnosis not present

## 2023-06-06 DIAGNOSIS — F1520 Other stimulant dependence, uncomplicated: Secondary | ICD-10-CM | POA: Diagnosis not present

## 2023-06-08 DIAGNOSIS — F419 Anxiety disorder, unspecified: Secondary | ICD-10-CM | POA: Diagnosis not present

## 2023-06-08 DIAGNOSIS — F329 Major depressive disorder, single episode, unspecified: Secondary | ICD-10-CM | POA: Diagnosis not present

## 2023-06-08 DIAGNOSIS — F1020 Alcohol dependence, uncomplicated: Secondary | ICD-10-CM | POA: Diagnosis not present

## 2023-06-08 DIAGNOSIS — F17200 Nicotine dependence, unspecified, uncomplicated: Secondary | ICD-10-CM | POA: Diagnosis not present

## 2023-06-08 DIAGNOSIS — F4310 Post-traumatic stress disorder, unspecified: Secondary | ICD-10-CM | POA: Diagnosis not present

## 2023-06-08 DIAGNOSIS — G4700 Insomnia, unspecified: Secondary | ICD-10-CM | POA: Diagnosis not present

## 2023-06-08 DIAGNOSIS — Z8616 Personal history of COVID-19: Secondary | ICD-10-CM | POA: Diagnosis not present

## 2023-06-08 DIAGNOSIS — F1120 Opioid dependence, uncomplicated: Secondary | ICD-10-CM | POA: Diagnosis not present

## 2023-06-08 DIAGNOSIS — Z681 Body mass index (BMI) 19 or less, adult: Secondary | ICD-10-CM | POA: Diagnosis not present

## 2023-06-08 DIAGNOSIS — F1320 Sedative, hypnotic or anxiolytic dependence, uncomplicated: Secondary | ICD-10-CM | POA: Diagnosis not present

## 2023-06-08 DIAGNOSIS — R634 Abnormal weight loss: Secondary | ICD-10-CM | POA: Diagnosis not present

## 2023-06-08 DIAGNOSIS — F1520 Other stimulant dependence, uncomplicated: Secondary | ICD-10-CM | POA: Diagnosis not present

## 2023-06-11 DIAGNOSIS — F1320 Sedative, hypnotic or anxiolytic dependence, uncomplicated: Secondary | ICD-10-CM | POA: Diagnosis not present

## 2023-06-11 DIAGNOSIS — F1020 Alcohol dependence, uncomplicated: Secondary | ICD-10-CM | POA: Diagnosis not present

## 2023-06-11 DIAGNOSIS — F1520 Other stimulant dependence, uncomplicated: Secondary | ICD-10-CM | POA: Diagnosis not present

## 2023-06-12 ENCOUNTER — Telehealth (HOSPITAL_COMMUNITY): Payer: Self-pay | Admitting: Licensed Clinical Social Worker

## 2023-06-12 DIAGNOSIS — R634 Abnormal weight loss: Secondary | ICD-10-CM | POA: Diagnosis not present

## 2023-06-12 DIAGNOSIS — F4310 Post-traumatic stress disorder, unspecified: Secondary | ICD-10-CM | POA: Diagnosis not present

## 2023-06-12 DIAGNOSIS — Z8616 Personal history of COVID-19: Secondary | ICD-10-CM | POA: Diagnosis not present

## 2023-06-12 DIAGNOSIS — G4700 Insomnia, unspecified: Secondary | ICD-10-CM | POA: Diagnosis not present

## 2023-06-12 DIAGNOSIS — F1320 Sedative, hypnotic or anxiolytic dependence, uncomplicated: Secondary | ICD-10-CM | POA: Diagnosis not present

## 2023-06-12 DIAGNOSIS — F419 Anxiety disorder, unspecified: Secondary | ICD-10-CM | POA: Diagnosis not present

## 2023-06-12 DIAGNOSIS — F329 Major depressive disorder, single episode, unspecified: Secondary | ICD-10-CM | POA: Diagnosis not present

## 2023-06-12 DIAGNOSIS — F17200 Nicotine dependence, unspecified, uncomplicated: Secondary | ICD-10-CM | POA: Diagnosis not present

## 2023-06-12 DIAGNOSIS — F1520 Other stimulant dependence, uncomplicated: Secondary | ICD-10-CM | POA: Diagnosis not present

## 2023-06-12 DIAGNOSIS — F1020 Alcohol dependence, uncomplicated: Secondary | ICD-10-CM | POA: Diagnosis not present

## 2023-06-12 DIAGNOSIS — Z681 Body mass index (BMI) 19 or less, adult: Secondary | ICD-10-CM | POA: Diagnosis not present

## 2023-06-12 DIAGNOSIS — F1120 Opioid dependence, uncomplicated: Secondary | ICD-10-CM | POA: Diagnosis not present

## 2023-06-12 NOTE — Telephone Encounter (Signed)
The therapist receives the following email from [place]:  "Hi! I just wanted to shoot you an email to thank you, Margaret Alvarado and Margaret Alvarado. I completed residential at [facility], I am currently in the [facility] program and will return to work this Friday. I moved into an [address] and this was life changing. I am 40 days clean and sober! I may have struggled and stumbled during IOP but it was part of this path to recovery. I am in RECOVERY for the 1st time in my life.  Margaret Alvarado"  The therapist responds with a separate email with the following:  " Congratulations and thanks for the good news!"  [name], MA, LCSW, [facility], LCAS 06/12/2023

## 2023-06-13 DIAGNOSIS — F1320 Sedative, hypnotic or anxiolytic dependence, uncomplicated: Secondary | ICD-10-CM | POA: Diagnosis not present

## 2023-06-13 DIAGNOSIS — F1520 Other stimulant dependence, uncomplicated: Secondary | ICD-10-CM | POA: Diagnosis not present

## 2023-06-13 DIAGNOSIS — F1020 Alcohol dependence, uncomplicated: Secondary | ICD-10-CM | POA: Diagnosis not present

## 2023-06-13 DIAGNOSIS — F329 Major depressive disorder, single episode, unspecified: Secondary | ICD-10-CM | POA: Diagnosis not present

## 2023-06-13 DIAGNOSIS — Z681 Body mass index (BMI) 19 or less, adult: Secondary | ICD-10-CM | POA: Diagnosis not present

## 2023-06-13 DIAGNOSIS — Z8616 Personal history of COVID-19: Secondary | ICD-10-CM | POA: Diagnosis not present

## 2023-06-13 DIAGNOSIS — F17200 Nicotine dependence, unspecified, uncomplicated: Secondary | ICD-10-CM | POA: Diagnosis not present

## 2023-06-13 DIAGNOSIS — F419 Anxiety disorder, unspecified: Secondary | ICD-10-CM | POA: Diagnosis not present

## 2023-06-13 DIAGNOSIS — R634 Abnormal weight loss: Secondary | ICD-10-CM | POA: Diagnosis not present

## 2023-06-13 DIAGNOSIS — G4700 Insomnia, unspecified: Secondary | ICD-10-CM | POA: Diagnosis not present

## 2023-06-13 DIAGNOSIS — F1120 Opioid dependence, uncomplicated: Secondary | ICD-10-CM | POA: Diagnosis not present

## 2023-06-13 DIAGNOSIS — F4310 Post-traumatic stress disorder, unspecified: Secondary | ICD-10-CM | POA: Diagnosis not present

## 2023-07-01 ENCOUNTER — Telehealth: Payer: No Typology Code available for payment source

## 2023-07-11 ENCOUNTER — Ambulatory Visit: Payer: No Typology Code available for payment source | Admitting: Physician Assistant

## 2023-07-16 NOTE — Progress Notes (Deleted)
 New patient visit   Patient: Margaret Alvarado   DOB: [birth year]   38 y.o. Female  MRN: [id number] Visit Date: 07/17/2023  Today's healthcare provider: Manuelita Flatness, PA-C   No chief complaint on file.  Subjective    Margaret Alvarado is a 38 y.o. female who presents today as a new patient to establish care.   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   Past Surgical History:  Procedure Laterality Date   APPENDECTOMY     05/23/2011   CESAREAN SECTION     LAPAROSCOPIC APPENDECTOMY  05/23/2011   Procedure: APPENDECTOMY LAPAROSCOPIC;  Surgeon: Jina Nephew, MD;  Location: [facility] OR;  Service: General;  Laterality: N/A;   TUBAL LIGATION     Family Status  Relation Name Status   Mother  Alive   Father  Deceased   MGM  (Not Specified)  No partnership data on file   Family History  Problem Relation Age of Onset   Basal cell carcinoma Mother    Hypertension Father    Heart disease Father    Diabetes Father    Heart disease Maternal Grandmother    Hypertension Maternal Grandmother    Stroke Maternal Grandmother    Social History   Socioeconomic History   Marital status: Married    Spouse name: Dorise   Number of children: 4   Years of education: Not on file   Highest education level: Not on file  Occupational History   Occupation: Customer Service  Tobacco Use   Smoking status: Every Day    Current packs/day: 0.50    Average packs/day: 0.5 packs/day for 25.0 years (12.5 ttl pk-yrs)    Types: Cigarettes    Start date: 2000    Passive exposure: Past   Smokeless tobacco: Never  Vaping Use   Vaping status: Never Used  Substance and Sexual Activity   Alcohol  use: Yes    Comment: occassional   Drug use: Not Currently   Sexual activity: Yes    Partners: Male    Birth control/protection: Surgical  Other Topics Concern   Not  on file  Social History Narrative   Not on file   Social Drivers of Health   Financial Resource Strain: Not on file  Food Insecurity: Not on file  Transportation Needs: Not on file  Physical Activity: Insufficiently Active (10/12/2020)   Exercise Vital Sign    Days of Exercise per Week: 4 days    Minutes of Exercise per Session: 30 min  Stress: Not on file (05/11/2023)  Social Connections: Unknown (11/06/2021)   Received from [facility]   Social Network    Social Network: Not on file   Outpatient Medications Prior to Visit  Medication Sig   albuterol  (VENTOLIN  HFA) 108 (90 Base) MCG/ACT inhaler Inhale 2 puffs into the lungs every 6 (six) hours as needed for wheezing or shortness of breath.   baclofen  (LIORESAL ) 10 MG tablet Take 1 tablet (10 mg total) by mouth 3 (three) times daily.   benzonatate  (TESSALON ) 100 MG capsule Take 1 capsule (100 mg total) by mouth 3 (three) times daily as needed.   desvenlafaxine (PRISTIQ) 50 MG 24 hr tablet Take 50 mg by mouth daily.   EPINEPHrine  0.3 mg/0.3 mL IJ SOAJ injection Inject 0.3 mg into the muscle as needed for anaphylaxis.  fluticasone  (FLONASE ) 50 MCG/ACT nasal spray Place 2 sprays into both nostrils daily.   mupirocin  ointment (BACTROBAN ) 2 % Apply 1 Application topically 2 (two) times daily.   No facility-administered medications prior to visit.   No Known Allergies  There is no immunization history for the selected administration types on file for this patient.  Health Maintenance  Topic Date Due   COVID-19 Vaccine (1) Never done   Pneumococcal Vaccine 64-49 Years old (1 of 2 - PCV) Never done   HIV Screening  Never done   Hepatitis C Screening  Never done   DTaP/Tdap/Td (1 - Tdap) Never done   INFLUENZA VACCINE  Never done   Cervical Cancer Screening (HPV/Pap Cotest)  11/02/2025   HPV VACCINES  Aged Out    Patient Care Team: Early, Camie BRAVO, NP as PCP - General (Nurse Practitioner)  Review of  Systems  {Insert previous labs (optional):23779} {See past labs  Heme  Chem  Endocrine  Serology  Results Review (optional):1}   Objective    There were no vitals taken for this visit. {Insert last BP/Wt (optional):23777}{See vitals history (optional):1}   Physical Exam ***  Depression Screen    03/14/2023    2:34 PM 01/11/2023   11:08 AM 10/12/2020    9:53 AM  PHQ 2/9 Scores  PHQ - 2 Score   0  PHQ- 9 Score   3     Information is confidential and restricted. Go to Review Flowsheets to unlock data.   No results found for any visits on 07/17/23.  Assessment & Plan     There are no diagnoses linked to this encounter.   No follow-ups on file.      Manuelita Flatness, PA-C  [facility] [phone] (phone) [phone] (fax)  [facility]

## 2023-07-17 ENCOUNTER — Ambulatory Visit: Payer: No Typology Code available for payment source | Admitting: Physician Assistant

## 2023-08-12 ENCOUNTER — Telehealth: Payer: No Typology Code available for payment source | Admitting: Family

## 2023-08-12 ENCOUNTER — Other Ambulatory Visit: Payer: Self-pay | Admitting: Nurse Practitioner

## 2023-08-12 DIAGNOSIS — R6889 Other general symptoms and signs: Secondary | ICD-10-CM

## 2023-08-12 MED ORDER — BENZONATATE 200 MG PO CAPS: 200.0000 mg | ORAL_CAPSULE | Freq: Two times a day (BID) | ORAL | 0 refills | Status: DC | PRN

## 2023-08-12 NOTE — Progress Notes (Signed)
 E visit for Flu like symptoms   We are sorry that you are not feeling well.  Here is how we plan to help! Based on what you have shared with me it looks like you may have a respiratory virus that may be influenza.  Influenza or "the flu" is   an infection caused by a respiratory virus. The flu virus is highly contagious and persons who did not receive their yearly flu vaccination may "catch" the flu from close contact.  We have anti-viral medications to treat the viruses that cause this infection. They are not a "cure" and only shorten the course of the infection. These prescriptions are most effective when they are given within the first 2 days of "flu" symptoms. Antiviral medication are indicated if you have a high risk of complications from the flu. You should  also consider an antiviral medication if you are in close contact with someone who is at risk. These medications can help patients avoid complications from the flu  but have side effects that you should know. Possible side effects from Tamiflu  or oseltamivir  include nausea, vomiting, diarrhea, dizziness, headaches, eye redness, sleep problems or other respiratory symptoms. You should not take Tamiflu  if you have an allergy to oseltamivir  or any to the ingredients in Tamiflu .  Based upon your symptoms and potential risk factors I recommend that you follow the flu symptoms recommendation that I have listed below. I have sent tessalon  200 mg three times a day as needed.   ANYONE WHO HAS FLU SYMPTOMS SHOULD: Stay home. The flu is highly contagious and going out or to work exposes others! Be sure to drink plenty of fluids. Water is fine as well as fruit juices, sodas and electrolyte beverages. You may want to stay away from caffeine or alcohol . If you are nauseated, try taking small sips of liquids. How do you know if you are getting enough fluid? Your urine should be a pale yellow or almost colorless. Get rest. Taking a steamy shower or using a  humidifier may help nasal congestion and ease sore throat pain. Using a saline nasal spray works much the same way. Cough drops, hard candies and sore throat lozenges may ease your cough. Line up a caregiver. Have someone check on you regularly.   GET HELP RIGHT AWAY IF: You cannot keep down liquids or your medications. You become short of breath Your fell like you are going to pass out or loose consciousness. Your symptoms persist after you have completed your treatment plan MAKE SURE YOU  Understand these instructions. Will watch your condition. Will get help right away if you are not doing well or get worse.  Your e-visit answers were reviewed by a board certified advanced clinical practitioner to complete your personal care plan.  Depending on the condition, your plan could have included both over the counter or prescription medications.  If there is a problem please reply  once you have received a response from your provider.  Your safety is important to us .  If you have drug allergies check your prescription carefully.    You can use MyChart to ask questions about today's visit, request a non-urgent call back, or ask for a work or school excuse for 24 hours related to this e-Visit. If it has been greater than 24 hours you will need to follow up with your provider, or enter a new e-Visit to address those concerns.  You will get an e-mail in the next two days asking about  your experience.  I hope that your e-visit has been valuable and will speed your recovery. Thank you for using e-visits.  Approximately 5 minutes was spent documenting and reviewing patient's chart.

## 2023-08-13 ENCOUNTER — Ambulatory Visit: Payer: No Typology Code available for payment source

## 2023-08-15 ENCOUNTER — Ambulatory Visit
Admission: RE | Admit: 2023-08-15 | Discharge: 2023-08-15 | Disposition: A | Discharge status: Home / Self Care | Payer: No Typology Code available for payment source | Source: Ambulatory Visit | Attending: Family Medicine | Admitting: Family Medicine

## 2023-08-15 ENCOUNTER — Other Ambulatory Visit: Payer: Self-pay

## 2023-08-15 VITALS — BP 91/60 | HR 95 | Temp 97.8°F | Resp 16 | Ht 67.0 in | Wt 135.0 lb

## 2023-08-15 DIAGNOSIS — J040 Acute laryngitis: Secondary | ICD-10-CM | POA: Diagnosis not present

## 2023-08-15 DIAGNOSIS — U071 COVID-19: Secondary | ICD-10-CM | POA: Diagnosis not present

## 2023-08-15 DIAGNOSIS — J209 Acute bronchitis, unspecified: Secondary | ICD-10-CM

## 2023-08-15 DIAGNOSIS — R051 Acute cough: Secondary | ICD-10-CM | POA: Diagnosis not present

## 2023-08-15 LAB — POC COVID19/FLU A&B COMBO
Covid Antigen, POC: NEGATIVE
Influenza A Antigen, POC: NEGATIVE
Influenza B Antigen, POC: NEGATIVE

## 2023-08-15 MED ORDER — PROMETHAZINE-DM 6.25-15 MG/5ML PO SYRP: 5.0000 mL | ORAL_SOLUTION | Freq: Four times a day (QID) | ORAL | 0 refills | Status: AC | PRN

## 2023-08-15 NOTE — ED Triage Notes (Signed)
Pt presents with complaints of fevers, nasal congestion, cough, bilateral ear fullness, loss of voice, and chest tightness x 3 days. Pt currently rates her pain a 3/10. Tylenol and Mucinex taken with minimal relief.

## 2023-08-15 NOTE — ED Provider Notes (Signed)
[name]    CSN: [id number] Arrival date & time: 08/15/23  1331      History   Chief Complaint Chief Complaint  Patient presents with   Cough    Congestion, ear ache. - Entered by patient   Laryngitis    HPI Margaret Alvarado is a 38 y.o. female.    Cough Not feeling well for 4 days symptoms include nasal congestion, rhinorrhea, ear fullness, had fever first day to 100 cough which was initially dry now with some sputum, sore throat worse with coughing now has loss of voice.  Admits soreness in her chest when she coughs denies shortness of breath or wheezing.  Son was sick 2 weeks ago with fever tested negative for COVID and flu.  Denies known exposures to COVID or flu taking over-the-counter meds without relief of symptoms.  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    Patient Active Problem List   Diagnosis Date Noted   Encounter to establish care with new doctor 10/12/2020   Hair loss 10/12/2020   Weight gain 10/12/2020   Fatigue 10/12/2020   Generalized anxiety disorder 10/12/2020   Intermittent palpitations 10/12/2020   Laboratory tests ordered as part of a complete physical exam (CPE) 10/12/2020   Stress incontinence, female 10/12/2020   Sinobronchitis 10/12/2020   Benign cyst of right breast in female 07/14/2020   Anxiety 05/28/2020   S/P laparoscopic appendectomy 05/23/2011    Past Surgical History:  Procedure Laterality Date   APPENDECTOMY     05/23/2011   CESAREAN SECTION     LAPAROSCOPIC APPENDECTOMY  05/23/2011   Procedure: APPENDECTOMY LAPAROSCOPIC;  Surgeon: Almond Lint, MD;  Location: [facility] OR;  Service: General;  Laterality: N/A;   TUBAL LIGATION      OB History   No obstetric history on file.      Home Medications    Prior to Admission medications   Medication Sig Start Date End Date Taking?  Authorizing Provider  naltrexone (DEPADE) 50 MG tablet Take 50 mg by mouth daily. 08/10/23  Yes [provider]  albuterol (VENTOLIN HFA) 108 (90 Base) MCG/ACT inhaler Inhale 2 puffs into the lungs every 6 (six) hours as needed for wheezing or shortness of breath. 03/02/22   Waldon Merl, PA-C  baclofen (LIORESAL) 10 MG tablet Take 1 tablet (10 mg total) by mouth 3 (three) times daily. 01/24/23 01/24/24  Court Joy, PA-C  benzonatate (TESSALON) 200 MG capsule Take 1 capsule (200 mg total) by mouth 2 (two) times daily as needed for cough. 08/12/23   Jannifer Rodney A, FNP  desvenlafaxine (PRISTIQ) 50 MG 24 hr tablet Take 50 mg by mouth daily. 02/28/23   [provider]  EPINEPHrine 0.3 mg/0.3 mL IJ SOAJ injection Inject 0.3 mg into the muscle as needed for anaphylaxis. 12/28/22   Pollyann Savoy, MD  fluticasone (FLONASE) 50 MCG/ACT nasal spray Place 2 sprays into both nostrils daily. 02/09/23   Margaretann Loveless, PA-C  mupirocin ointment (BACTROBAN) 2 % Apply 1 Application topically 2 (two) times daily. 03/19/22   Claiborne Rigg, NP    Family History Family History  Problem Relation Age of Onset   Basal cell carcinoma Mother    Hypertension Father    Heart disease Father    Diabetes Father    Heart disease Maternal Grandmother  Hypertension Maternal Grandmother    Stroke Maternal Grandmother     Social History Social History   Tobacco Use   Smoking status: Every Day    Current packs/day: 0.50    Average packs/day: 0.5 packs/day for 25.1 years (12.6 ttl pk-yrs)    Types: Cigarettes    Start date: 2000    Passive exposure: Past   Smokeless tobacco: Never  Vaping Use   Vaping status: Never Used  Substance Use Topics   Alcohol use: Yes    Comment: occassional   Drug use: Not Currently     Allergies   Patient has no known allergies.   Review of Systems Review of Systems  Respiratory:  Positive for cough.      Physical Exam Triage Vital  Signs ED Triage Vitals  Encounter Vitals Group     BP 08/15/23 1338 91/60     Systolic BP Percentile --      Diastolic BP Percentile --      Pulse Rate 08/15/23 1338 95     Resp 08/15/23 1338 16     Temp 08/15/23 1338 97.8 F (36.6 C)     Temp Source 08/15/23 1338 Oral     SpO2 08/15/23 1338 97 %     Weight 08/15/23 1344 135 lb (61.2 kg)     Height 08/15/23 1344 5\' 7"  (1.702 m)     Head Circumference --      Peak Flow --      Pain Score 08/15/23 1344 3     Pain Loc --      Pain Education --      Exclude from Growth Chart --    No data found.  Updated Vital Signs BP 91/60 (BP Location: Right Arm)   Pulse 95   Temp 97.8 F (36.6 C) (Oral)   Resp 16   Ht 5\' 7"  (1.702 m)   Wt 135 lb (61.2 kg)   LMP 08/08/2023 (Exact Date)   SpO2 97%   BMI 21.14 kg/m   Visual Acuity Right Eye Distance:   Left Eye Distance:   Bilateral Distance:    Right Eye Near:   Left Eye Near:    Bilateral Near:     Physical Exam Constitutional:      Comments: Loss of voice  HENT:     Head: Normocephalic and atraumatic.     Left Ear: Tympanic membrane and ear canal normal.     Ears:     Comments: TM has effusion without erythema retraction or bulging    Nose: Congestion present. No rhinorrhea.     Mouth/Throat:     Mouth: Mucous membranes are moist.     Pharynx: Oropharynx is clear. No oropharyngeal exudate or posterior oropharyngeal erythema.     Comments: Normal swallowing no evidence of peritonsillar abscess Eyes:     Conjunctiva/sclera: Conjunctivae normal.  Cardiovascular:     Rate and Rhythm: Normal rate and regular rhythm.     Heart sounds: Normal heart sounds.  Pulmonary:     Effort: Pulmonary effort is normal. No respiratory distress.     Breath sounds: No wheezing, rhonchi or rales.  Musculoskeletal:     Cervical back: Neck supple.  Lymphadenopathy:     Cervical: No cervical adenopathy.  Skin:    General: Skin is warm and dry.  Neurological:     Mental Status: She is  alert and oriented to person, place, and time.  Psychiatric:        Mood and  Affect: Mood normal.      UC Treatments / Results  Labs (all labs ordered are listed, but only abnormal results are displayed) Labs Reviewed - No data to display  EKG   Radiology No results found.  Procedures Procedures (including critical care time)  Medications Ordered in UC Medications - No data to display  Initial Impression / Assessment and Plan / UC Course  I have reviewed the triage vital signs and the nursing notes.  Pertinent labs & imaging results that were available during my care of the patient were reviewed by me and considered in my medical decision making (see chart for details).     38 year old with 1 week of cough associated with ear pain and sore throat, nasal congestion, loss of voice and chest discomfort.  Her vital signs are stable, she has loss of voice but normal throat exam, lungs are clear to auscultation no evidence of bacterial infection on exam.  Point-of-care COVID and flu are negative.  Gust with patient likely viral syndrome home care follow-up and warning signs reviewed with patient, Rx Promethazine DM for cough follow-up as needed Final Clinical Impressions(s) / UC Diagnoses   Final diagnoses:  None   Discharge Instructions   None    ED Prescriptions   None    PDMP not reviewed this encounter.   Meliton Rattan, [place] 08/15/23 1410

## 2023-09-20 DIAGNOSIS — R5383 Other fatigue: Secondary | ICD-10-CM | POA: Diagnosis not present

## 2023-09-20 DIAGNOSIS — E559 Vitamin D deficiency, unspecified: Secondary | ICD-10-CM | POA: Diagnosis not present

## 2023-09-20 DIAGNOSIS — Z0000 Encounter for general adult medical examination without abnormal findings: Secondary | ICD-10-CM | POA: Diagnosis not present

## 2023-10-02 ENCOUNTER — Telehealth

## 2023-10-02 DIAGNOSIS — K047 Periapical abscess without sinus: Secondary | ICD-10-CM

## 2023-10-03 MED ORDER — AMOXICILLIN-POT CLAVULANATE 875-125 MG PO TABS: 1.0000 | ORAL_TABLET | Freq: Two times a day (BID) | ORAL | 0 refills | Status: DC

## 2023-10-03 NOTE — Progress Notes (Signed)
 I have spent 5 minutes in review of e-visit questionnaire, review and updating patient chart, medical decision making and response to patient.   Piedad Climes, PA-C

## 2023-10-03 NOTE — Progress Notes (Signed)

## 2023-10-08 ENCOUNTER — Telehealth: Admitting: Physician Assistant

## 2023-10-08 DIAGNOSIS — H1013 Acute atopic conjunctivitis, bilateral: Secondary | ICD-10-CM

## 2023-10-08 MED ORDER — OLOPATADINE HCL 0.1 % OP SOLN: 1.0000 [drp] | Freq: Two times a day (BID) | OPHTHALMIC | 0 refills | Status: AC

## 2023-10-08 MED ORDER — CETIRIZINE HCL 10 MG PO TABS: 10.0000 mg | ORAL_TABLET | Freq: Every day | ORAL | 0 refills | Status: AC

## 2023-10-08 MED ORDER — FLUTICASONE PROPIONATE 50 MCG/ACT NA SUSP: 2.0000 | Freq: Every day | NASAL | 0 refills | Status: DC

## 2023-10-08 NOTE — Progress Notes (Signed)
 E visit for Allergic Rhinitis We are sorry that you are not feeling well.  Here is how we plan to help!  Based on what you have shared with me it looks like you have Allergic Rhinitis.  Rhinitis is when a reaction occurs that causes nasal congestion, runny nose, sneezing, and itching.  Most types of rhinitis are caused by an inflammation and are associated with symptoms in the eyes ears or throat. There are several types of rhinitis.  The most common are acute rhinitis, which is usually caused by a viral illness, allergic or seasonal rhinitis, and nonallergic or year-round rhinitis.  Nasal allergies occur certain times of the year.  Allergic rhinitis is caused when allergens in the air trigger the release of histamine in the body.  Histamine causes itching, swelling, and fluid to build up in the fragile linings of the nasal passages, sinuses and eyelids.  An itchy nose and clear discharge are common.  I recommend the following over the counter treatments: You should take a daily dose of antihistamine; I have prescribed Cetirizine 10 mg Take 1 tablet daily  I also would recommend a nasal spray: I have prescribed: Flonase 2 sprays into each nostril once daily  You may also benefit from eye drops such as: I have prescribed: Olopatadine 0.1% drops Use 1 drop in each eye twice daily  HOME CARE:  You can use an over-the-counter saline nasal spray as needed Avoid areas where there is heavy dust, mites, or molds Stay indoors on windy days during the pollen season Keep windows closed in home, at least in bedroom; use air conditioner. Use high-efficiency house air filter Keep windows closed in car, turn AC on re-circulate Avoid playing out with dog during pollen season  GET HELP RIGHT AWAY IF:  If your symptoms do not improve within 10 days You become short of breath You develop yellow or green discharge from your nose for over 3 days You have coughing fits  MAKE SURE YOU:  Understand these  instructions Will watch your condition Will get help right away if you are not doing well or get worse  Thank you for choosing an e-visit. Your e-visit answers were reviewed by a board certified advanced clinical practitioner to complete your personal care plan. Depending upon the condition, your plan could have included both over the counter or prescription medications. Please review your pharmacy choice. Be sure that the pharmacy you have chosen is open so that you can pick up your prescription now.  If there is a problem you may message your provider in MyChart to have the prescription routed to another pharmacy. Your safety is important to [place]. If you have drug allergies check your prescription carefully.  For the next 24 hours, you can use MyChart to ask questions about today's visit, request a non-urgent call back, or ask for a work or school excuse from your e-visit provider. You will get an email in the next two days asking about your experience. I hope that your e-visit has been valuable and will speed your recovery.      I have spent 5 minutes in review of e-visit questionnaire, review and updating patient chart, medical decision making and response to patient.   Margaretann Loveless, PA-C

## 2023-11-21 DIAGNOSIS — N946 Dysmenorrhea, unspecified: Secondary | ICD-10-CM | POA: Diagnosis not present

## 2023-11-21 DIAGNOSIS — N939 Abnormal uterine and vaginal bleeding, unspecified: Secondary | ICD-10-CM | POA: Diagnosis not present

## 2023-12-03 ENCOUNTER — Telehealth: Admitting: Family Medicine

## 2023-12-03 DIAGNOSIS — J069 Acute upper respiratory infection, unspecified: Secondary | ICD-10-CM | POA: Diagnosis not present

## 2023-12-03 MED ORDER — BENZONATATE 100 MG PO CAPS: 100.0000 mg | ORAL_CAPSULE | Freq: Three times a day (TID) | ORAL | 0 refills | Status: DC | PRN

## 2023-12-03 MED ORDER — FLUTICASONE PROPIONATE 50 MCG/ACT NA SUSP
2.0000 | Freq: Every day | NASAL | 0 refills | Status: DC
Start: 2023-12-03 — End: 2024-03-14

## 2023-12-03 NOTE — Progress Notes (Signed)

## 2023-12-19 ENCOUNTER — Telehealth: Admitting: Physician Assistant

## 2023-12-19 DIAGNOSIS — K047 Periapical abscess without sinus: Secondary | ICD-10-CM

## 2023-12-19 MED ORDER — AMOXICILLIN-POT CLAVULANATE 875-125 MG PO TABS: 1.0000 | ORAL_TABLET | Freq: Two times a day (BID) | ORAL | 0 refills | Status: DC

## 2023-12-19 NOTE — Progress Notes (Signed)

## 2023-12-24 ENCOUNTER — Ambulatory Visit

## 2023-12-24 ENCOUNTER — Telehealth: Admitting: Physician Assistant

## 2023-12-24 DIAGNOSIS — J029 Acute pharyngitis, unspecified: Secondary | ICD-10-CM

## 2023-12-24 NOTE — Progress Notes (Signed)
  Because you are currently on an antibiotic that should cover for bacteria causing a sore throat, I feel your condition warrants further evaluation and I recommend that you be seen in a face-to-face visit.   NOTE: There will be NO CHARGE for this E-Visit   If you are having a true medical emergency, please call 911.     For an urgent face to face visit, [facility] has multiple urgent care centers for your convenience.  Click the link below for the full list of locations and hours, walk-in wait times, appointment scheduling options and driving directions:  Urgent Care - [place], [place], [place], [place], [place], [place]       Your MyChart E-visit questionnaire answers were reviewed by a board certified advanced clinical practitioner to complete your personal care plan based on your specific symptoms.    Thank you for using e-Visits.     I have spent 5 minutes in review of e-visit questionnaire, review and updating patient chart, medical decision making and response to patient.   Delon CHRISTELLA Dickinson, PA-C

## 2023-12-25 ENCOUNTER — Ambulatory Visit

## 2024-01-18 DIAGNOSIS — Z3202 Encounter for pregnancy test, result negative: Secondary | ICD-10-CM | POA: Diagnosis not present

## 2024-01-18 DIAGNOSIS — Z30430 Encounter for insertion of intrauterine contraceptive device: Secondary | ICD-10-CM | POA: Diagnosis not present

## 2024-01-18 DIAGNOSIS — N939 Abnormal uterine and vaginal bleeding, unspecified: Secondary | ICD-10-CM | POA: Diagnosis not present

## 2024-01-24 ENCOUNTER — Telehealth: Payer: Self-pay

## 2024-01-24 DIAGNOSIS — F411 Generalized anxiety disorder: Secondary | ICD-10-CM

## 2024-01-24 NOTE — Progress Notes (Signed)
 Complex Care Management Note Care Guide Note  01/24/2024 Name: Margaret Alvarado MRN: [id number] DOB: 04-Aug-1985   Complex Care Management Outreach Attempts: An unsuccessful telephone outreach was attempted today to offer the patient information about available complex care management services.  Follow Up Plan:  Additional outreach attempts will be made to offer the patient complex care management information and services.   Encounter Outcome:  No Answer  Jeoffrey Buffalo , RMA     [facility], [facility] Guide  Direct Dial: [phone]  Website: [email or web address]

## 2024-01-28 NOTE — Progress Notes (Signed)
 Complex Care Management Note  Care Guide Note 01/28/2024 Name: Margaret Alvarado MRN: [id number] DOB: 10-23-1985  [name] is a 38 y.o. year old female who sees Early, Camie BRAVO, NP for primary care. I reached out to Powell Bunker by phone today to offer complex care management services.  [name] was given information about Complex Care Management services today including:   The Complex Care Management services include support from the care team which includes your Nurse Care Manager, Clinical Social Worker, or Pharmacist.  The Complex Care Management team is here to help remove barriers to the health concerns and goals most important to you. Complex Care Management services are voluntary, and the patient may decline or stop services at any time by request to their care team member.   Complex Care Management Consent Status: Patient did not agree to participate in complex care management services at this time.  Follow up plan:    Encounter Outcome:  Patient Refused  Jeoffrey Buffalo , RMA     [facility]  [facility], [facility] Guide  Direct Dial: [phone]  Website: [email or web address]

## 2024-03-14 ENCOUNTER — Telehealth: Payer: MEDICAID | Admitting: Physician Assistant

## 2024-03-14 DIAGNOSIS — H6992 Unspecified Eustachian tube disorder, left ear: Secondary | ICD-10-CM | POA: Diagnosis not present

## 2024-03-14 DIAGNOSIS — H60332 Swimmer's ear, left ear: Secondary | ICD-10-CM

## 2024-03-14 MED ORDER — FLUTICASONE PROPIONATE 50 MCG/ACT NA SUSP: 2.0000 | Freq: Every day | NASAL | 0 refills | Status: AC

## 2024-03-14 MED ORDER — NEOMYCIN-POLYMYXIN-HC 3.5-10000-1 OT SOLN: 3.0000 [drp] | Freq: Four times a day (QID) | OTIC | 0 refills | Status: AC

## 2024-03-14 NOTE — Progress Notes (Signed)
 E Visit for Ear Pain - Swimmer's Ear  We are sorry that you are not feeling well. Here is how we plan to help!  Based on what you have shared with me it looks like you have Swimmer's Ear.  Swimmer's ear is a redness or swelling, irritation, or infection of your outer ear canal. These symptoms usually occur within a few days of swimming. Your ear canal is a tube that goes from the opening of the ear to the eardrum.  When water stays in your ear canal, germs can grow.  This is a painful condition that often happens to children and swimmers of all ages.  It is not contagious and oral antibiotics are not required to treat uncomplicated swimmer's ear.  The usual symptoms include:    Itchiness inside the ear  Redness or a sense of swelling in the ear  Pain when the ear is tugged on when pressure is placed on the ear  Pus draining from the infected ear   I have prescribed: Cortisporin ear drops Place 3 drops into affected ear every 6 hours while awake for 7 days.  I do recommend to use Fluticasone  nasal spray as well for Eustachian Tube Dysfunction. Use 2 sprays in each nostril daily for 10-14 days.  In certain cases, swimmer's ear may progress to a more serious bacterial infection of the middle or inner ear.  If you have a fever 102 and up and significantly worsening symptoms, this could indicate a more serious infection moving to the middle/inner and needs face to face evaluation in an office by a provider.  Your symptoms should improve over the next 3 days and should resolve in about 7 days.  Be sure to complete ALL of your prescription.  HOME CARE: Wash your hands frequently. If you are prescribed an ear drop, do not place the tip of the bottle on your ear or touch it with your fingers. You can take Acetaminophen  650 mg every 4-6 hours as needed for pain.  If pain is severe or moderate, you can apply a heating pad (set on low) or hot water bottle (wrapped in a towel) to outer ear for 20 minutes.   This will also increase drainage. Avoid ear plugs Do not go swimming until the symptoms are gone Do not use Q-tips After showers, help the water run out by tilting your head to one side.   GET HELP RIGHT AWAY IF: Fever is over 102.2 degrees. You develop progressive ear pain or hearing loss. Ear symptoms persist longer than 3 days after treatment.  MAKE SURE YOU: Understand these instructions. Will watch your condition. Will get help right away if you are not doing well or get worse.  TO PREVENT SWIMMER'S EAR: Use a bathing cap or custom fitted swim molds to keep your ears dry. Towel off after swimming to dry your ears. Tilt your head or pull your earlobes to allow the water to escape your ear canal. If there is still water in your ears, consider using a hairdryer on the lowest setting.  Thank you for choosing an e-visit.  Your e-visit answers were reviewed by a board certified advanced clinical practitioner to complete your personal care plan. Depending upon the condition, your plan could have included both over the counter or prescription medications.  Please review your pharmacy choice. Make sure the pharmacy is open so you can pick up the prescription now. If there is a problem, you may contact your provider through [employer] and  have the prescription routed to another pharmacy.  Your safety is important to us . If you have drug allergies check your prescription carefully.   For the next 24 hours you can use MyChart to ask questions about today's visit, request a non-urgent call back, or ask for a work or school excuse. You will get an email with a survey after your eVisit asking about your experience. We would appreciate your feedback. I hope that your e-visit has been valuable and will aid in your recovery.     I have spent 5 minutes in review of e-visit questionnaire, review and updating patient chart, medical decision making and response to patient.   Delon CHRISTELLA Dickinson, PA-C

## 2024-06-05 ENCOUNTER — Telehealth: Payer: MEDICAID | Admitting: Physician Assistant

## 2024-06-05 DIAGNOSIS — K047 Periapical abscess without sinus: Secondary | ICD-10-CM

## 2024-06-05 MED ORDER — AMOXICILLIN-POT CLAVULANATE 875-125 MG PO TABS: 1.0000 | ORAL_TABLET | Freq: Two times a day (BID) | ORAL | 0 refills | Status: AC

## 2024-06-05 NOTE — Progress Notes (Signed)
 E-Visit for Dental Pain  We are sorry that you are not feeling well.  Here is how we plan to help!  Based on what you have shared with me in the questionnaire, it sounds like you have a dental infection. I have prescribed Augmentin  875-125mg  twice a day for 7 days  It is imperative that you see a dentist within 10 days of this eVisit to determine the cause of the dental pain and be sure it is adequately treated  A toothache or tooth pain is caused when the nerve in the root of a tooth or surrounding a tooth is irritated. Dental (tooth) infection, decay, injury, or loss of a tooth are the most common causes of dental pain. Pain may also occur after an extraction (tooth is pulled out). Pain sometimes originates from other areas and radiates to the jaw, thus appearing to be tooth pain.Bacteria growing inside your mouth can contribute to gum disease and dental decay, both of which can cause pain. A toothache occurs from inflammation of the central portion of the tooth called pulp. The pulp contains nerve endings that are very sensitive to pain. Inflammation to the pulp or pulpitis may be caused by dental cavities, trauma, and infection.    HOME CARE:   For toothaches: Over-the-counter pain medications such as acetaminophen  or ibuprofen  may be used. Take these as directed on the package while you arrange for a dental appointment. Avoid very cold or hot foods, because they may make the pain worse. You may get relief from biting on a cotton ball soaked in oil of cloves. You can get oil of cloves at most drug stores.  For jaw pain:  Aspirin may be helpful for problems in the joint of the jaw in adults. If pain happens every time you open your mouth widely, the temporomandibular joint (TMJ) may be the source of the pain. Yawning or taking a large bite of food may worsen the pain. An appointment with your doctor or dentist will help you find the cause.     GET HELP RIGHT AWAY IF:  You have a high  fever or chills If you have had a recent head or face injury and develop headache, light headedness, nausea, vomiting, or other symptoms that concern you after an injury to your face or mouth, you could have a more serious injury in addition to your dental injury. A facial rash associated with a toothache: This condition may improve with medication. Contact your doctor for them to decide what is appropriate. Any jaw pain occurring with chest pain: Although jaw pain is most commonly caused by dental disease, it is sometimes referred pain from other areas. People with heart disease, especially people who have had stents placed, people with diabetes, or those who have had heart surgery may have jaw pain as a symptom of heart attack or angina. If your jaw or tooth pain is associated with lightheadedness, sweating, or shortness of breath, you should see a doctor as soon as possible. Trouble swallowing or excessive pain or bleeding from gums: If you have a history of a weakened immune system, diabetes, or steroid use, you may be more susceptible to infections. Infections can often be more severe and extensive or caused by unusual organisms. Dental and gum infections in people with these conditions may require more aggressive treatment. An abscess may need draining or IV antibiotics, for example.  MAKE SURE YOU   Understand these instructions. Will watch your condition. Will get help right away if  you are not doing well or get worse.  Thank you for choosing an e-visit.  Your e-visit answers were reviewed by a board certified advanced clinical practitioner to complete your personal care plan. Depending upon the condition, your plan could have included both over the counter or prescription medications.  Please review your pharmacy choice. Make sure the pharmacy is open so you can pick up prescription now. If there is a problem, you may contact your provider through [employer] and have the prescription  routed to another pharmacy.  Your safety is important to us . If you have drug allergies check your prescription carefully.   For the next 24 hours you can use MyChart to ask questions about today's visit, request a non-urgent call back, or ask for a work or school excuse. You will get an email in the next two days asking about your experience. I hope that your e-visit has been valuable and will speed your recovery.  I have spent 5 minutes in review of e-visit questionnaire, review and updating patient chart, medical decision making and response to patient.   Elsie Velma Lunger, PA-C
